# Patient Record
Sex: Female | Born: 1945 | Race: Black or African American | Hispanic: No | Marital: Single | Smoking: Never smoker
Health system: Southern US, Community
[De-identification: ages and names within clinical notes are randomized; demographics above are authoritative.]

## PROBLEM LIST (undated history)

## (undated) DIAGNOSIS — N289 Disorder of kidney and ureter, unspecified: Secondary | ICD-10-CM

## (undated) DIAGNOSIS — I2510 Atherosclerotic heart disease of native coronary artery without angina pectoris: Secondary | ICD-10-CM

## (undated) DIAGNOSIS — F0390 Unspecified dementia without behavioral disturbance: Secondary | ICD-10-CM

## (undated) DIAGNOSIS — I639 Cerebral infarction, unspecified: Secondary | ICD-10-CM

## (undated) DIAGNOSIS — I10 Essential (primary) hypertension: Secondary | ICD-10-CM

## (undated) DIAGNOSIS — R569 Unspecified convulsions: Secondary | ICD-10-CM

## (undated) DIAGNOSIS — E119 Type 2 diabetes mellitus without complications: Secondary | ICD-10-CM

---

## 2011-01-27 ENCOUNTER — Inpatient Hospital Stay: Payer: Self-pay | Admitting: Internal Medicine

## 2011-01-28 DIAGNOSIS — I517 Cardiomegaly: Secondary | ICD-10-CM

## 2011-01-28 DIAGNOSIS — R748 Abnormal levels of other serum enzymes: Secondary | ICD-10-CM

## 2011-02-11 ENCOUNTER — Encounter: Payer: Self-pay | Admitting: Cardiovascular Disease

## 2011-02-18 ENCOUNTER — Encounter: Payer: Self-pay | Admitting: Cardiovascular Disease

## 2012-10-15 IMAGING — CT CT HEAD WITHOUT CONTRAST
2 series · 15 of 30 positions shown, 19 images · non-contrast
Comparison: none

REASON FOR EXAM: syncope
COMMENTS:

[Series 2: without · axial · non-contrast · 0.43mm/px · z∈[-222,-92]mm · 13 of 32 slices shown, 17 images]
[im 3/32  brain]
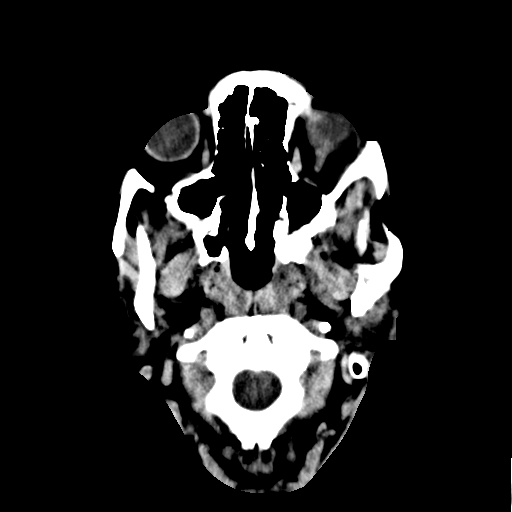
[im 3/32  bone]
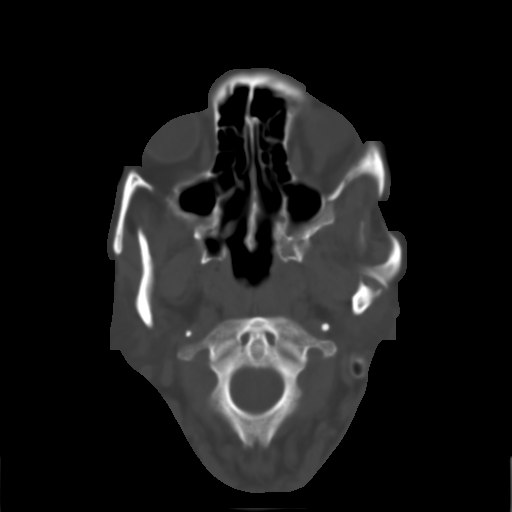
[im 5/32  brain]
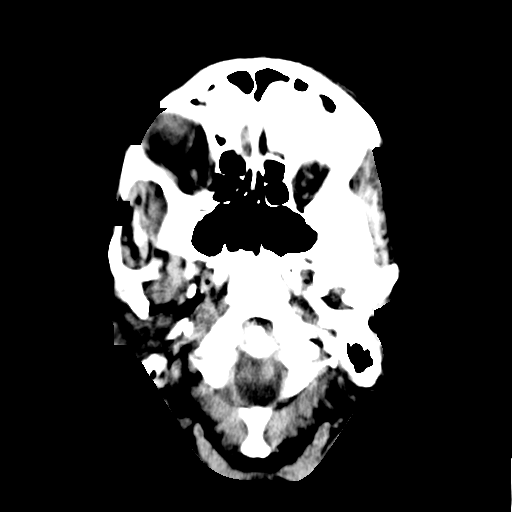
[im 7/32  brain]
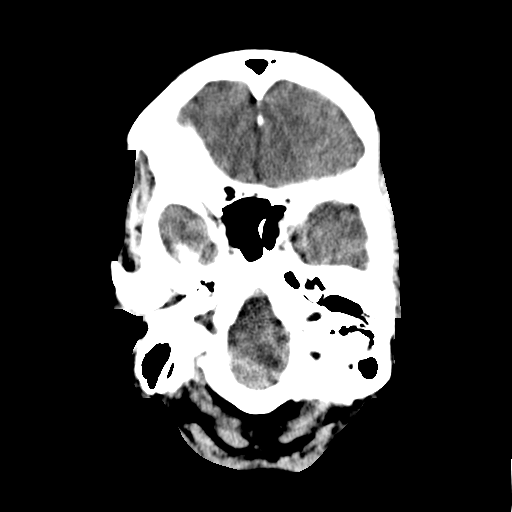
[im 9/32  brain]
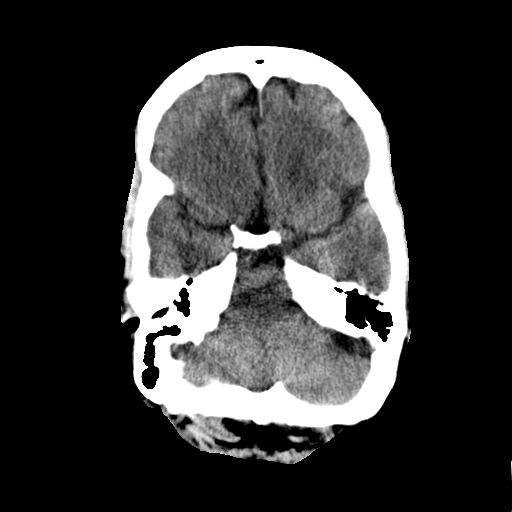
[im 12/32  brain]
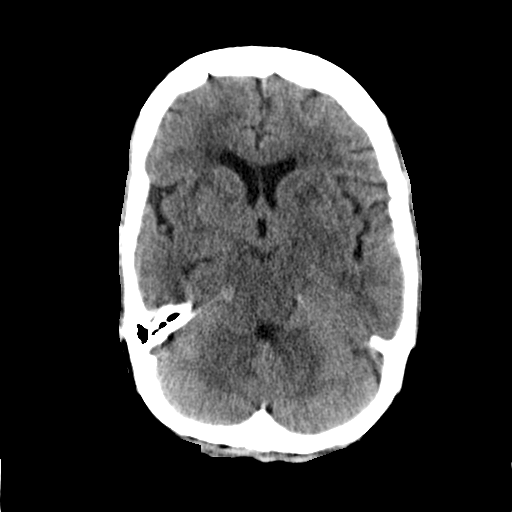
[im 12/32  bone]
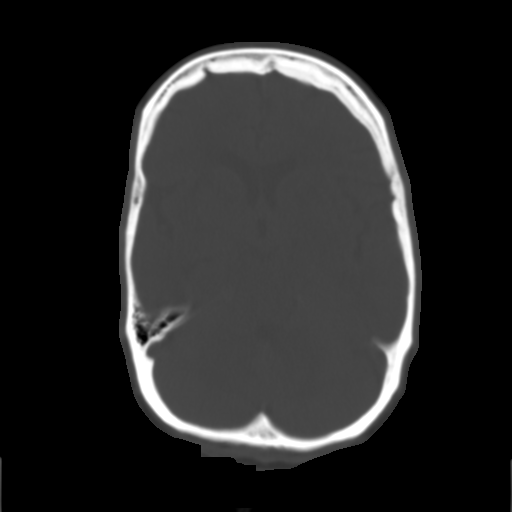
[im 14/32  brain]
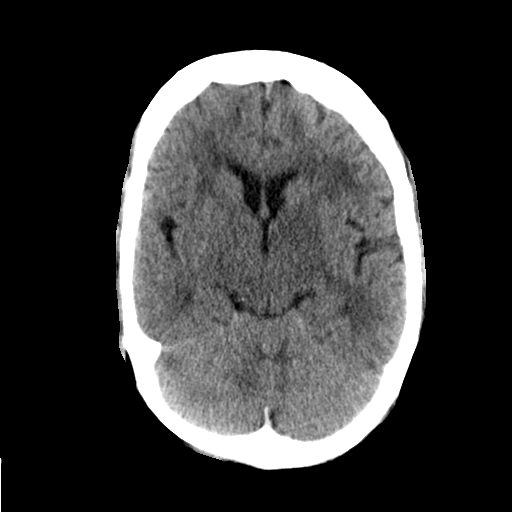
[im 16/32  brain]
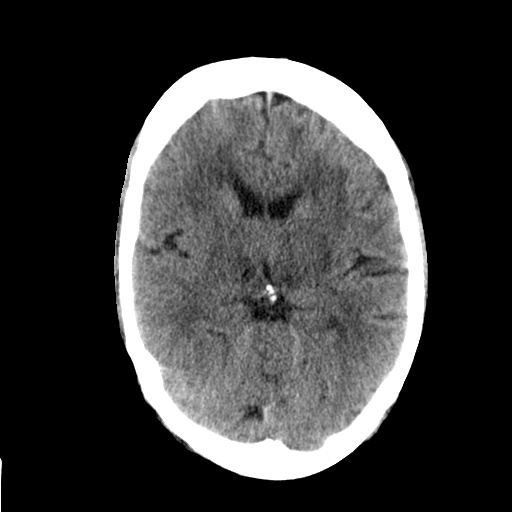
[im 18/32  brain]
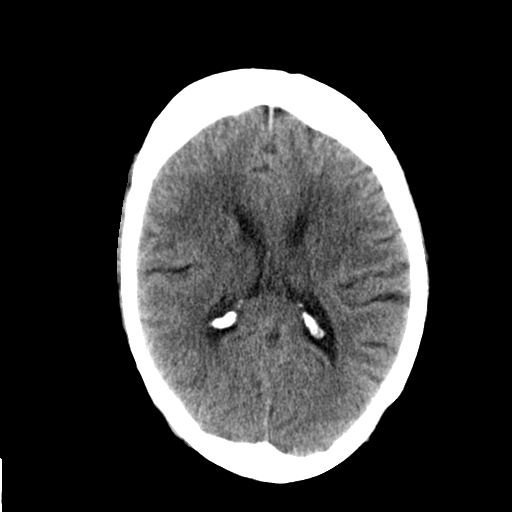
[im 20/32  brain]
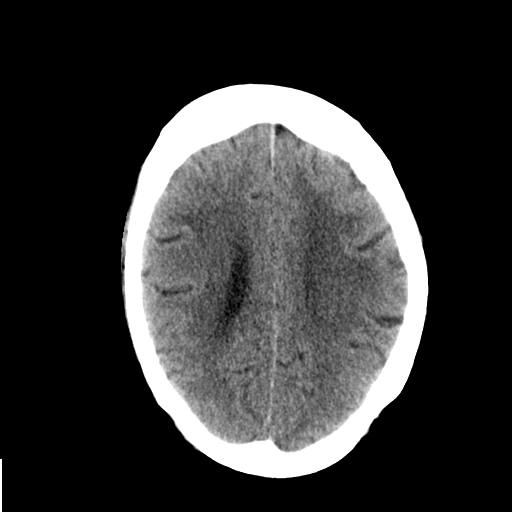
[im 20/32  bone]
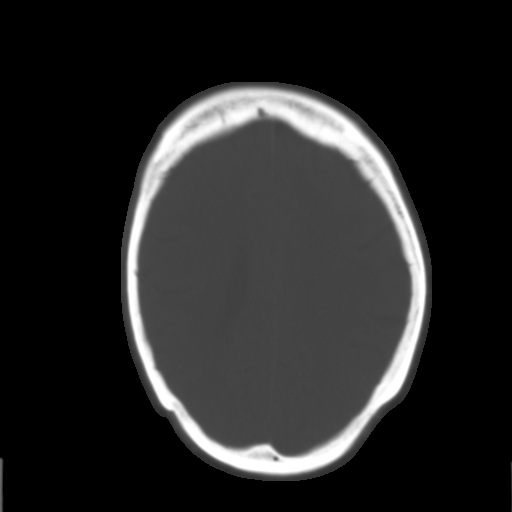
[im 23/32  brain]
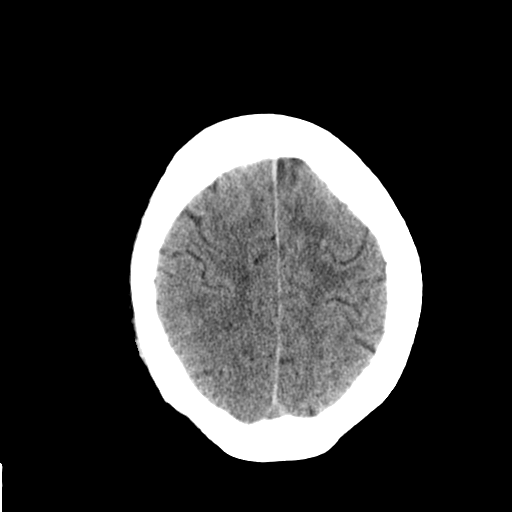
[im 25/32  brain]
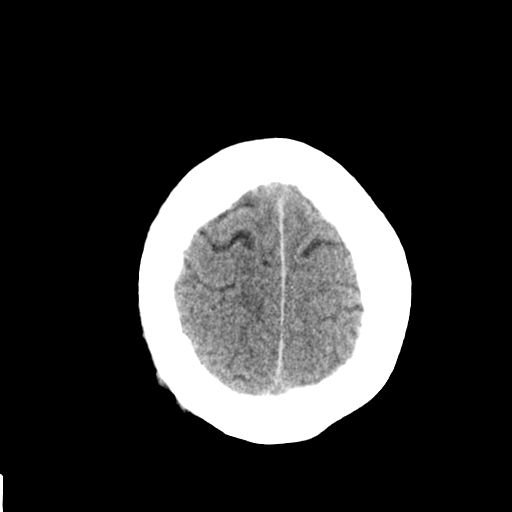
[im 27/32  brain]
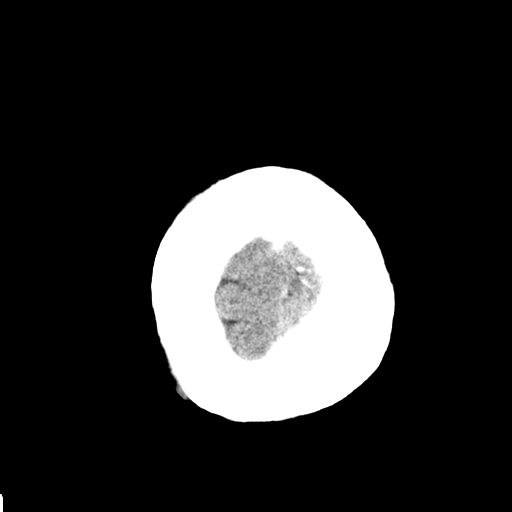
[im 29/32  brain]
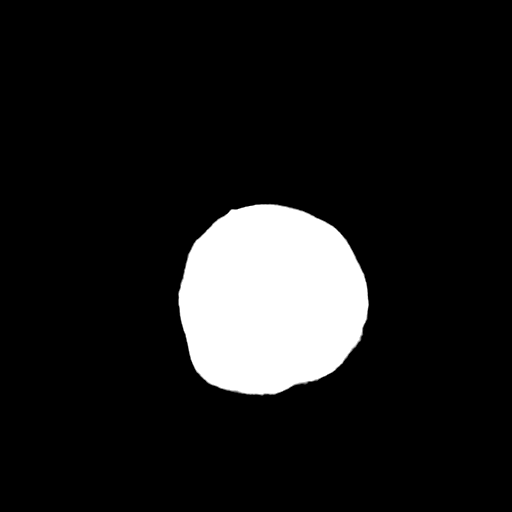
[im 29/32  bone]
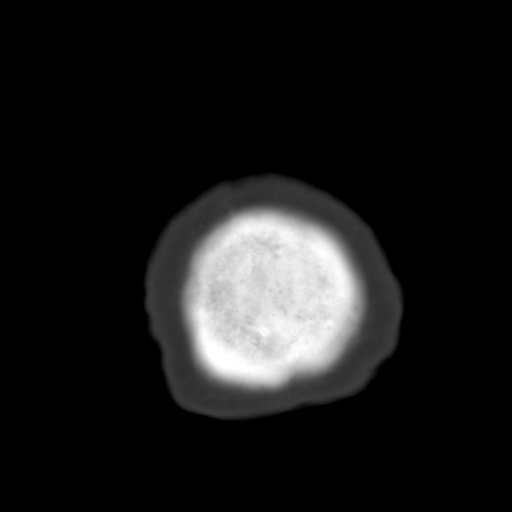

[Series 3: bone · axial · 0.43mm/px · z∈[-222,-202]mm · 2 of 32 slices shown]
[im 3/32  bone]
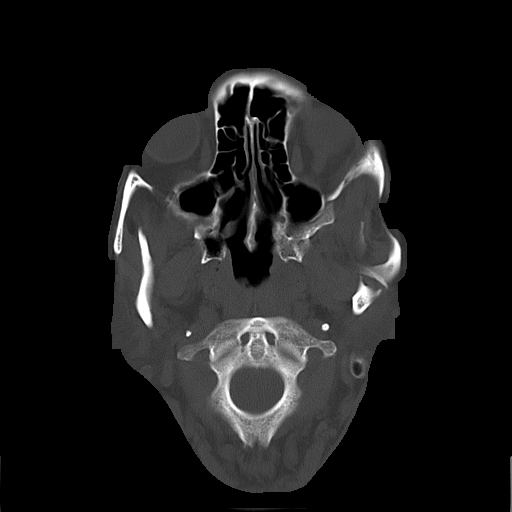
[im 7/32  bone]
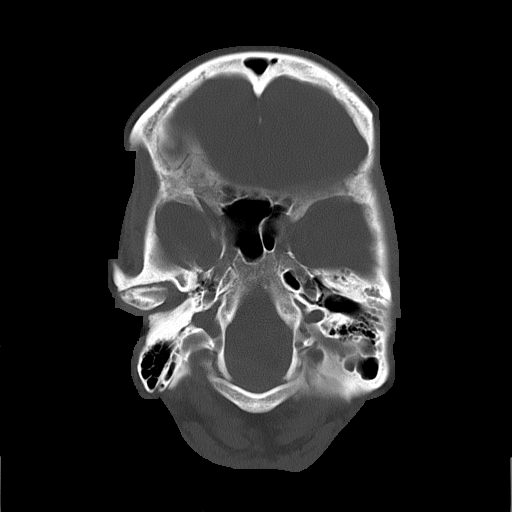

[15 of 30 positions shown; findings below may reference images not displayed]

PROCEDURE:     CT  - CT HEAD WITHOUT CONTRAST  - January 27, 2011  [DATE]

RESULT:     Axial noncontrast CT scanning was performed through the brain at
5 mm intervals and slice thicknesses. There are no previous studies for
comparison.

The ventricles are normal in size and position. There is decreased density
in the deep white matter of both cerebral hemispheres which likely reflects
the sequelae of chronic small vessel ischemic type change. There is
hypodensity in the thalamic nuclei bilaterally most compatible with previous
lacunar infarctions. The cerebellum and brainstem exhibit no acute
abnormality. There is no acute intracranial hemorrhage nor evidence of an
evolving ischemic event. At bone window settings the observed portions of
the paranasal sinuses reveal no air-fluid levels, but there is
mucoperiosteal thickening in the right maxillary sinus. I do not see
evidence of an acute skull fracture.
IMPRESSION: 1. I do not see evidence of an acute ischemic or hemorrhagic infarction.
2. There is no intracranial mass effect or hydrocephalus.
3. There is decreased density in the deep white matter of both cerebral
hemispheres as well as in the thalamic nuclei consistent with chronic small
vessel ischemic type change and likely old lacunar infarctions respectively.

## 2012-10-17 IMAGING — CR DG CHEST 1V PORT
1 series · 1 of 1 positions shown · non-contrast
Comparison: none

REASON FOR EXAM: acute sob
COMMENTS:

[view not recorded]
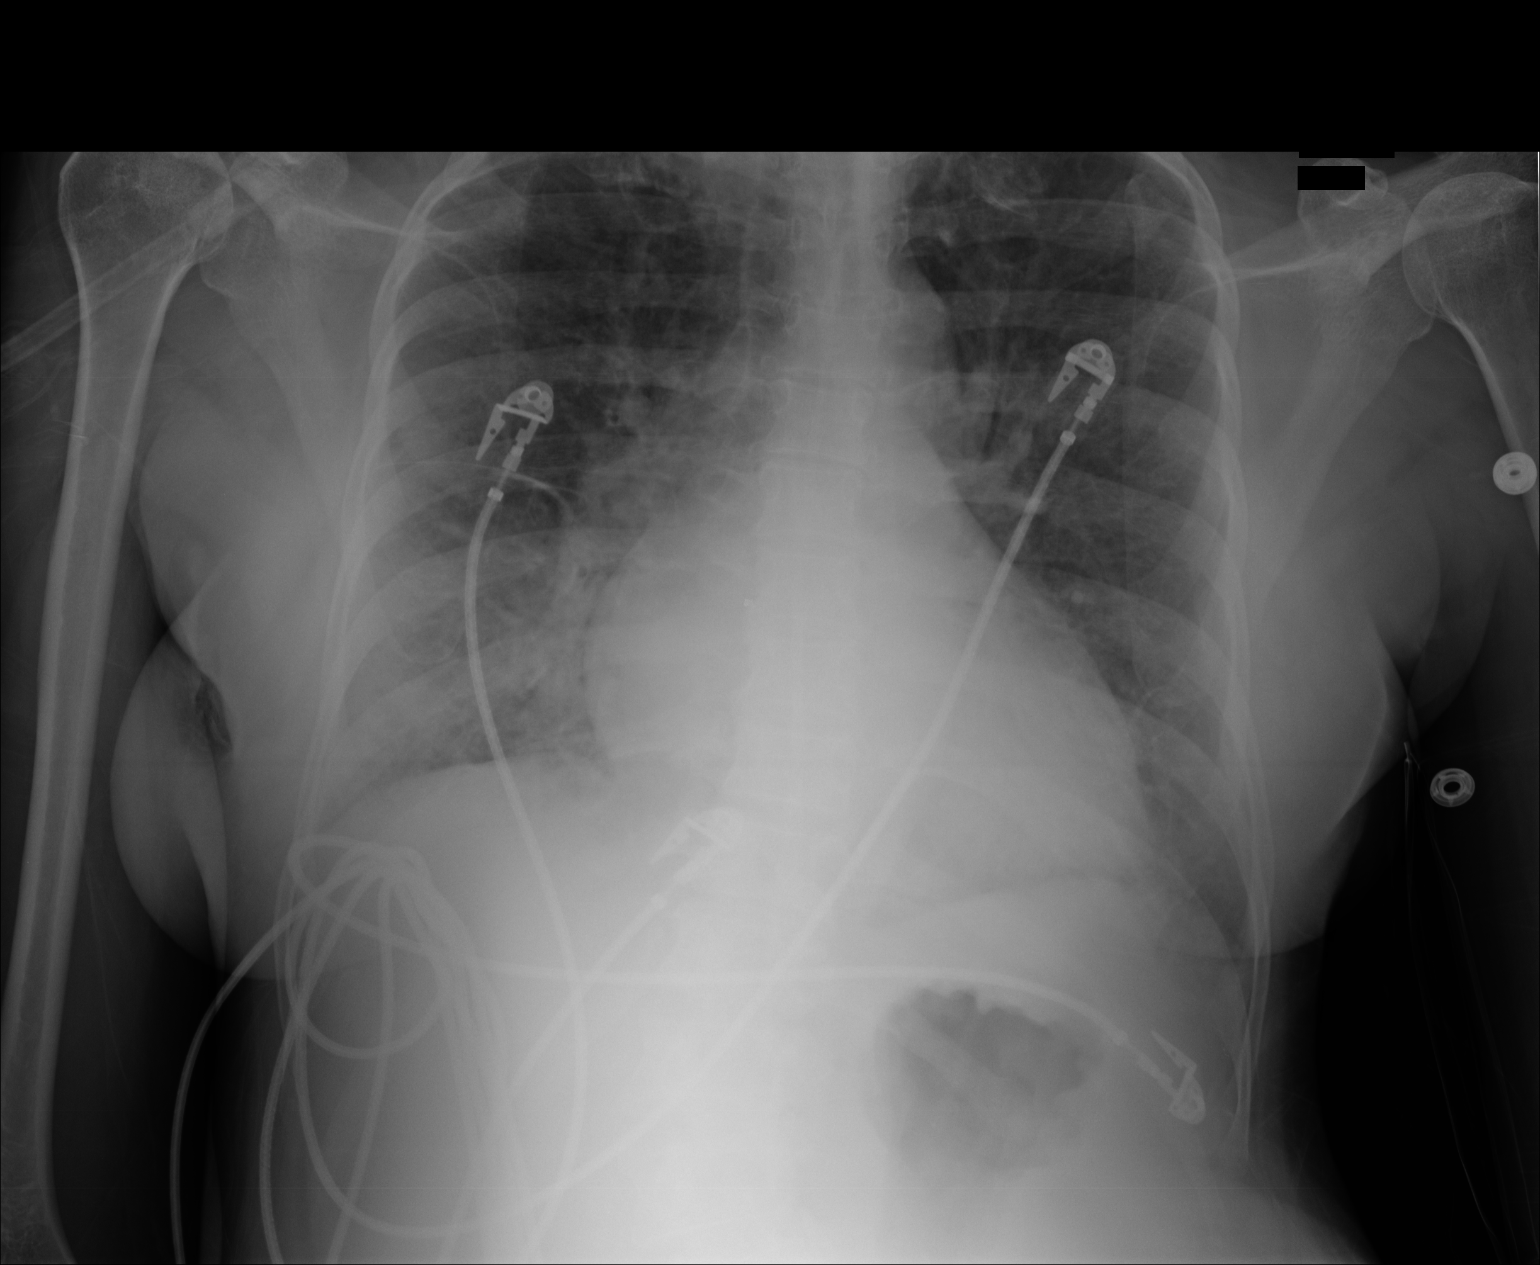

[1 of 1 positions shown; findings below may reference images not displayed]

PROCEDURE:     DXR - DXR PORTABLE CHEST SINGLE VIEW  - January 29, 2011  [DATE]

RESULT:     Comparison is made to the study 27 January, 2011.

The lungs are well-expanded. There are increased interstitial markings noted
on the right in the mid and lower lung. The pulmonary vascularity is
indistinct. The cardiac silhouette is enlarged. I see no pleural effusion.
IMPRESSION: The findings suggest low-grade CHF with mild interstitial
edema on the right especially. A followup PA and lateral chest x-ray would
be of value.

## 2014-11-21 ENCOUNTER — Emergency Department (HOSPITAL_COMMUNITY): Payer: Medicare Other

## 2014-11-21 ENCOUNTER — Encounter (HOSPITAL_COMMUNITY): Payer: Self-pay | Admitting: *Deleted

## 2014-11-21 ENCOUNTER — Emergency Department (HOSPITAL_COMMUNITY)
Admission: EM | Admit: 2014-11-21 | Discharge: 2014-11-21 | Disposition: A | Discharge status: Home / Self Care | Payer: Medicare Other | Attending: Emergency Medicine | Admitting: Emergency Medicine

## 2014-11-21 DIAGNOSIS — Z7982 Long term (current) use of aspirin: Secondary | ICD-10-CM | POA: Diagnosis not present

## 2014-11-21 DIAGNOSIS — I2510 Atherosclerotic heart disease of native coronary artery without angina pectoris: Secondary | ICD-10-CM | POA: Insufficient documentation

## 2014-11-21 DIAGNOSIS — Z79899 Other long term (current) drug therapy: Secondary | ICD-10-CM | POA: Insufficient documentation

## 2014-11-21 DIAGNOSIS — W01198A Fall on same level from slipping, tripping and stumbling with subsequent striking against other object, initial encounter: Secondary | ICD-10-CM | POA: Diagnosis not present

## 2014-11-21 DIAGNOSIS — N189 Chronic kidney disease, unspecified: Secondary | ICD-10-CM | POA: Insufficient documentation

## 2014-11-21 DIAGNOSIS — Z8673 Personal history of transient ischemic attack (TIA), and cerebral infarction without residual deficits: Secondary | ICD-10-CM | POA: Diagnosis not present

## 2014-11-21 DIAGNOSIS — R55 Syncope and collapse: Secondary | ICD-10-CM | POA: Diagnosis present

## 2014-11-21 DIAGNOSIS — Y9289 Other specified places as the place of occurrence of the external cause: Secondary | ICD-10-CM | POA: Diagnosis not present

## 2014-11-21 DIAGNOSIS — S0990XA Unspecified injury of head, initial encounter: Secondary | ICD-10-CM | POA: Diagnosis not present

## 2014-11-21 DIAGNOSIS — I129 Hypertensive chronic kidney disease with stage 1 through stage 4 chronic kidney disease, or unspecified chronic kidney disease: Secondary | ICD-10-CM | POA: Insufficient documentation

## 2014-11-21 DIAGNOSIS — R000 Tachycardia, unspecified: Secondary | ICD-10-CM | POA: Insufficient documentation

## 2014-11-21 DIAGNOSIS — Y998 Other external cause status: Secondary | ICD-10-CM | POA: Diagnosis not present

## 2014-11-21 DIAGNOSIS — Y9389 Activity, other specified: Secondary | ICD-10-CM | POA: Diagnosis not present

## 2014-11-21 DIAGNOSIS — G40909 Epilepsy, unspecified, not intractable, without status epilepticus: Secondary | ICD-10-CM | POA: Insufficient documentation

## 2014-11-21 HISTORY — DX: Atherosclerotic heart disease of native coronary artery without angina pectoris: I25.10

## 2014-11-21 HISTORY — DX: Cerebral infarction, unspecified: I63.9

## 2014-11-21 HISTORY — DX: Essential (primary) hypertension: I10

## 2014-11-21 HISTORY — DX: Disorder of kidney and ureter, unspecified: N28.9

## 2014-11-21 LAB — CBC WITH DIFFERENTIAL/PLATELET
BASOS ABS: 0 10*3/uL (ref 0.0–0.1)
BASOS PCT: 0 % (ref 0–1)
Eosinophils Absolute: 0 10*3/uL (ref 0.0–0.7)
Eosinophils Relative: 0 % (ref 0–5)
HCT: 38.6 % (ref 36.0–46.0)
HEMOGLOBIN: 12.4 g/dL (ref 12.0–15.0)
Lymphocytes Relative: 17 % (ref 12–46)
Lymphs Abs: 1.2 10*3/uL (ref 0.7–4.0)
MCH: 26.5 pg (ref 26.0–34.0)
MCHC: 32.1 g/dL (ref 30.0–36.0)
MCV: 82.5 fL (ref 78.0–100.0)
MONO ABS: 0.6 10*3/uL (ref 0.1–1.0)
MONOS PCT: 9 % (ref 3–12)
Neutro Abs: 4.9 10*3/uL (ref 1.7–7.7)
Neutrophils Relative %: 74 % (ref 43–77)
PLATELETS: 224 10*3/uL (ref 150–400)
RBC: 4.68 MIL/uL (ref 3.87–5.11)
RDW: 13.7 % (ref 11.5–15.5)
WBC: 6.7 10*3/uL (ref 4.0–10.5)

## 2014-11-21 LAB — COMPREHENSIVE METABOLIC PANEL
ALT: 12 U/L — AB (ref 14–54)
AST: 17 U/L (ref 15–41)
Albumin: 3.8 g/dL (ref 3.5–5.0)
Alkaline Phosphatase: 49 U/L (ref 38–126)
Anion gap: 11 (ref 5–15)
BILIRUBIN TOTAL: 0.5 mg/dL (ref 0.3–1.2)
BUN: 43 mg/dL — ABNORMAL HIGH (ref 6–20)
CHLORIDE: 105 mmol/L (ref 101–111)
CO2: 21 mmol/L — AB (ref 22–32)
Calcium: 9.2 mg/dL (ref 8.9–10.3)
Creatinine, Ser: 2.3 mg/dL — ABNORMAL HIGH (ref 0.44–1.00)
GFR calc Af Amer: 24 mL/min — ABNORMAL LOW (ref >60)
GFR calc non Af Amer: 20 mL/min — ABNORMAL LOW (ref >60)
Glucose, Bld: 191 mg/dL — ABNORMAL HIGH (ref 65–99)
Potassium: 3.5 mmol/L (ref 3.5–5.1)
Sodium: 137 mmol/L (ref 135–145)
Total Protein: 6.9 g/dL (ref 6.5–8.1)

## 2014-11-21 LAB — URINALYSIS, ROUTINE W REFLEX MICROSCOPIC
Bilirubin Urine: NEGATIVE
Glucose, UA: NEGATIVE mg/dL
Hgb urine dipstick: NEGATIVE
Ketones, ur: NEGATIVE mg/dL
Leukocytes, UA: NEGATIVE
NITRITE: NEGATIVE
PH: 5.5 (ref 5.0–8.0)
Protein, ur: NEGATIVE mg/dL
Specific Gravity, Urine: 1.01 (ref 1.005–1.030)
Urobilinogen, UA: 0.2 mg/dL (ref 0.0–1.0)

## 2014-11-21 LAB — TROPONIN I: Troponin I: <0.03 ng/mL (ref <0.031)

## 2014-11-21 MED ORDER — SODIUM CHLORIDE 0.9 % IV BOLUS (SEPSIS)
500.0000 mL | Freq: Once | INTRAVENOUS | Status: AC
  Administered 2014-11-21: 500 mL via INTRAVENOUS

## 2014-11-21 NOTE — ED Notes (Signed)
Lab at bedside

## 2014-11-21 NOTE — Discharge Instructions (Signed)
Chronic Kidney Disease °Chronic kidney disease occurs when the kidneys are damaged over a long period. The kidneys are two organs that lie on either side of the spine between the middle of the back and the front of the abdomen. The kidneys:  °· Remove wastes and extra water from the blood.   °· Produce important hormones. These help keep bones strong, regulate blood pressure, and help create red blood cells.   °· Balance the fluids and chemicals in the blood and tissues. °A small amount of kidney damage may not cause problems, but a large amount of damage may make it difficult or impossible for the kidneys to work the way they should. If steps are not taken to slow down the kidney damage or stop it from getting worse, the kidneys may stop working permanently. Most of the time, chronic kidney disease does not go away. However, it can often be controlled, and those with the disease can usually live normal lives. °CAUSES  °The most common causes of chronic kidney disease are diabetes and high blood pressure (hypertension). Chronic kidney disease may also be caused by:  °· Diseases that cause the kidneys' filters to become inflamed.   °· Diseases that affect the immune system.   °· Genetic diseases.   °· Medicines that damage the kidneys, such as anti-inflammatory medicines.   °· Poisoning or exposure to toxic substances.   °· A reoccurring kidney or urinary infection.   °· A problem with urine flow. This may be caused by:   °¨ Cancer.   °¨ Kidney stones.   °¨ An enlarged prostate in males. °SIGNS AND SYMPTOMS  °Because the kidney damage in chronic kidney disease occurs slowly, symptoms develop slowly and may not be obvious until the kidney damage becomes severe. A person may have a kidney disease for years without showing any symptoms. Symptoms can include:  °· Swelling (edema) of the legs, ankles, or feet.   °· Tiredness (lethargy).   °· Nausea or vomiting.   °· Confusion.   °· Problems with urination, such as:    °¨ Decreased urine production.   °¨ Frequent urination, especially at night.   °¨ Frequent accidents in children who are potty trained.   °· Muscle twitches and cramps.   °· Shortness of breath.  °· Weakness.   °· Persistent itchiness.   °· Loss of appetite. °· Metallic taste in the mouth. °· Trouble sleeping. °· Slowed development in children. °· Short stature in children. °DIAGNOSIS  °Chronic kidney disease may be detected and diagnosed by tests, including blood, urine, imaging, or kidney biopsy tests.  °TREATMENT  °Most chronic kidney diseases cannot be cured. Treatment usually involves relieving symptoms and preventing or slowing the progression of the disease. Treatment may include:  °· A special diet. You may need to avoid alcohol and foods that are salty and high in potassium.   °· Medicines. These may:   °¨ Lower blood pressure.   °¨ Relieve anemia.   °¨ Relieve swelling.   °¨ Protect the bones. °HOME CARE INSTRUCTIONS  °· Follow your prescribed diet.   °· Take medicines only as directed by your health care provider. Do not take any new medicines (prescription, over-the-counter, or nutritional supplements) unless approved by your health care provider. Many medicines can worsen your kidney damage or need to have the dose adjusted.   °· Quit smoking if you smoke. Talk to your health care provider about a smoking cessation program.   °· Keep all follow-up visits as directed by your health care provider. °SEEK IMMEDIATE MEDICAL CARE IF: °· Your symptoms get worse or you develop new symptoms.   °· You develop symptoms of end-stage kidney disease. These   include:   Headaches.   Abnormally dark or light skin.   Numbness in the hands or feet.   Easy bruising.   Frequent hiccups.   Menstruation stops.   You have a fever.   You have decreased urine production.   You havepain or bleeding when urinating. MAKE SURE YOU:  Understand these instructions.  Will watch your condition.  Will  get help right away if you are not doing well or get worse. FOR MORE INFORMATION   American Association of Kidney Patients: BombTimer.gl  National Kid[employer] Foundation: www.kidney.[place]: https://mathis.com/  [facility]: www.lifeoptions.org and www.kidneyschool.org Document Released: 01/20/2008 Document Revised: 08/27/2013 Document Reviewed: 12/10/2011 [facility] Patient Information 2015 [place], [place]. This information is not intended to replace advice given to you by your health care provider. Make sure you discuss any questions you have with your health care provider.   Please obtain all of your results from medical records or have your doctors office obtain the results - share them with your doctor - you should be seen at your doctors office in the next 2 days. Call today to arrange your follow up. Take the medications as prescribed. Please review all of the medicines and only take them if you do not have an allergy to them. Please be aware that if you are taking birth control pills, taking other prescriptions, ESPECIALLY ANTIBIOTICS may make the birth control ineffective - if this is the case, either do not engage in sexual activity or use alternative methods of birth control such as condoms until you have finished the medicine and your family doctor says it is OK to restart them. If you are on a blood thinner such as COUMADIN, be aware that any other medicine that you take may cause the coumadin to either work too much, or not enough - you should have your coumadin level rechecked in next 7 days if this is the case.  ?  It is also a possibility that you have an allergic reaction to any of the medicines that you have been prescribed - Everybody reacts differently to medications and while MOST people have no trouble with most medicines, you may have a reaction such as nausea, vomiting, rash, swelling, shortness of breath. If this is the case, please stop taking  the medicine immediately and contact your physician.  ?  You should return to the ER if you develop severe or worsening symptoms.

## 2014-11-21 NOTE — ED Notes (Signed)
Pt comes in by EMS from local store. Pt had episode of syncope. Pt denies any neck or back pain. Pt is alert and oriented at this time. NAD noted. Pt is not on blood thinners.

## 2014-11-21 NOTE — ED Provider Notes (Signed)
CSN: [id number]     Arrival date & time 11/21/14  1357 History   First MD Initiated Contact with Patient 11/21/14 1357     Chief Complaint  Patient presents with  . Loss of Consciousness     (Consider location/radiation/quality/duration/timing/severity/associated sxs/prior Treatment) HPI Comments: The pt is a 69 y/o female with hx of stroke, htn and seizures - she takes meds for all these - she did not take her medicine on time today - took them at 1 PM - then ate and has been in the heat and shopping for the last hour - when she was getting ready to leave the store, she became acutely light headed and fell backwards striking her head on the ground with a LOC - the daughter turned around she noticed mild shaking activity - this resolved after a few seconds and she took less than one minute to come around.  She had no vomiting and got back to herself.  She denies prodromal HA / CP / palpitations / sob and has had normal appetite, no dysuria or diarrhea, no swelling, weakness or numbness.  She has no c/o at this time.  Patient is a 69 y.o. female presenting with syncope. The history is provided by the patient.  Loss of Consciousness   Past Medical History  Diagnosis Date  . Hypertension   . Stroke   . Renal disorder   . Coronary artery disease    History reviewed. No pertinent past surgical history. No family history on file. History  Substance Use Topics  . Smoking status: Never Smoker   . Smokeless tobacco: Not on file  . Alcohol Use: No   OB History    No data available     Review of Systems  Cardiovascular: Positive for syncope.  All other systems reviewed and are negative.     Allergies  Review of patient's allergies indicates no known allergies.  Home Medications   Prior to Admission medications   Medication Sig Start Date End Date Taking? Authorizing Provider  amLODipine (NORVASC) 10 MG tablet Take 10 mg by mouth daily.   Yes Historical Provider, MD  aspirin EC  81 MG tablet Take 81 mg by mouth daily.   Yes Historical Provider, MD  atorvastatin (LIPITOR) 20 MG tablet Take 20 mg by mouth daily.   Yes Historical Provider, MD  cholecalciferol (VITAMIN D) 1000 UNITS tablet Take 1,000 Units by mouth daily.   Yes Historical Provider, MD  citalopram (CELEXA) 40 MG tablet Take 40 mg by mouth daily.   Yes Historical Provider, MD  cloNIDine (CATAPRES) 0.2 MG tablet Take 0.2 mg by mouth 3 (three) times daily.   Yes Historical Provider, MD  docusate sodium (COLACE) 100 MG capsule Take 100 mg by mouth 2 (two) times daily.   Yes Historical Provider, MD  ferrous sulfate 325 (65 FE) MG tablet Take 325 mg by mouth 3 (three) times daily with meals.   Yes Historical Provider, MD  hydrALAZINE (APRESOLINE) 100 MG tablet Take 100 mg by mouth 3 (three) times daily.   Yes Historical Provider, MD  isosorbide dinitrate (ISORDIL) 20 MG tablet Take 20 mg by mouth 3 (three) times daily.   Yes Historical Provider, MD  levETIRAcetam (KEPPRA) 500 MG tablet Take 1,000 mg by mouth 2 (two) times daily.   Yes Historical Provider, MD  magnesium oxide (MAG-OX) 400 MG tablet Take 400 mg by mouth daily.   Yes Historical Provider, MD  potassium chloride SA (K-DUR,KLOR-CON) 20 MEQ tablet Take  20 mEq by mouth daily.   Yes Historical Provider, MD   BP 178/87 mmHg  Pulse 103  Temp(Src) 98 F (36.7 C) (Oral)  Resp 14  Ht 6' (1.829 m)  Wt 135 lb (61.236 kg)  BMI 18.31 kg/m2  SpO2 97% Physical Exam  Constitutional: She appears well-developed and well-nourished. No distress.  HENT:  Head: Normocephalic and atraumatic.  Mouth/Throat: Oropharynx is clear and moist. No oropharyngeal exudate.  no facial tenderness, deformity, malocclusion or hemotympanum.  no battle's sign or racoon eyes. No ttp over the scalp.  Eyes: Conjunctivae and EOM are normal. Pupils are equal, round, and reactive to light. Right eye exhibits no discharge. Left eye exhibits no discharge. No scleral icterus.  Neck: Normal  range of motion. Neck supple. No JVD present. No thyromegaly present.  Cardiovascular: Regular rhythm, normal heart sounds and intact distal pulses.  Exam reveals no gallop and no friction rub.   No murmur heard. Tachycardia but no JVD, no edema, normal pulses at hte radial arteries bilaterally.  Pulmonary/Chest: Effort normal and breath sounds normal. No respiratory distress. She has no wheezes. She has no rales.  Abdominal: Soft. Bowel sounds are normal. She exhibits no distension and no mass. There is no tenderness.  Musculoskeletal: Normal range of motion. She exhibits no edema or tenderness.  Lymphadenopathy:    She has no cervical adenopathy.  Neurological: She is alert. Coordination normal.  Neurologic exam:  Speech clear, pupils equal round reactive to light, extraocular movements intact  Normal peripheral visual fields Cranial nerves III through XII normal including no facial droop Follows commands, moves all extremities x4, normal strength to bilateral upper and lower extremities at all major muscle groups including grip Sensation normal to light touch and pinprick Coordination intact, no limb ataxia, finger-nose-finger normal Rapid alternating movements normal No pronator drift  Skin: Skin is warm and dry. No rash noted. No erythema.  Psychiatric: She has a normal mood and affect. Her behavior is normal.  Nursing note and vitals reviewed.   ED Course  Procedures (including critical care time) Labs Review Labs Reviewed  COMPREHENSIVE METABOLIC PANEL - Abnormal; Notable for the following:    CO2 21 (*)    Glucose, Bld 191 (*)    BUN 43 (*)    Creatinine, Ser 2.30 (*)    ALT 12 (*)    GFR calc non Af Amer 20 (*)    GFR calc Af Amer 24 (*)    All other components within normal limits  CBC WITH DIFFERENTIAL/PLATELET  URINALYSIS, ROUTINE W REFLEX MICROSCOPIC (NOT AT [facility])  TROPONIN I    Imaging Review Ct Head Wo Contrast  11/21/2014   CLINICAL DATA:  Syncopal episode  2 day.  EXAM: CT HEAD WITHOUT CONTRAST  TECHNIQUE: Contiguous axial images were obtained from the base of the skull through the vertex without intravenous contrast.  COMPARISON:  None.  FINDINGS: Moderate attenuation is noted in the anterior limb of the internal capsule bilaterally. There is involvement of the left caudate head. A nonhemorrhagic lacunar infarct is noted in the inferior aspect of the right lentiform nucleus. Nonhemorrhagic lacunar infarcts are present within the thalami bilaterally, right greater than left.  The insular cortex is intact bilaterally. The remainder the cortex is unremarkable.  Wall thickening and mucosal thickening in the right maxillary sinus is noted within antrostomy. The remaining paranasal sinuses are clear. The mastoid air cells are clear bilaterally. The calvarium is intact.  No significant extracranial soft tissue injury is present.  The globes and orbits are within normal limits.  IMPRESSION: 1. Age indeterminate lacunar infarcts within the basal ganglia bilaterally as described. These are likely remote. 2. No other acute intracranial abnormality. 3. Moderate atrophy and white matter disease is somewhat advanced for age.   Electronically Signed   By: San Morelle M.D.   On: 11/21/2014 15:13     EKG Interpretation   Date/Time:  Thursday November 21 2014 13:58:12 EDT Ventricular Rate:  125 PR Interval:  150 QRS Duration: 89 QT Interval:  311 QTC Calculation: 448 R Axis:   36 Text Interpretation:  Sinus tachycardia LAE, consider biatrial enlargement  Borderline repolarization abnormality Since last tracing rate faster  otherwise no other changes Confirmed by Ewald Beg  MD, Ocean Schildt ([id number]) on  11/21/2014 2:06:15 PM      EKG Interpretation  Date/Time:  Thursday November 21 2014 15:40:30 EDT Ventricular Rate:  99 PR Interval:  155 QRS Duration: 93 QT Interval:  347 QTC Calculation: 445 R Axis:   43 Text Interpretation:  Sinus rhythm Biatrial enlargement  Nonspecific T abnormalities, lateral leads Since last tracing rate slower Abnormal ekg Confirmed by Sabra Heck  MD, Anaysia Germer ([id number]) on 11/21/2014 3:43:19 PM        MDM   Final diagnoses:  Head injury  Syncope, unspecified syncope type  Chronic renal failure, unspecified stage    No signs of head injury - she has no neuro findings and no ha or neck pain - she has tachycardia which worsens when I tell her the w/u and improves when she relaxes - may just be anxiety related however will r/o dehydradtion / UTI / anemia / ICH / fracture - pt in agreement.  CT and labs unremarkable - she has baseline renal disease based on Care Everywhere from past visit to other hospitals.    Pt informed of results - VS without tachycardia - repeat ECG looks better- pt will f/u outpt.  Meds given in ED:  Medications  sodium chloride 0.9 % bolus 500 mL (0 mLs Intravenous Stopped 11/21/14 1543)    New Prescriptions   No medications on file        Noemi Chapel, MD 11/21/14 1635

## 2015-12-13 ENCOUNTER — Emergency Department (HOSPITAL_COMMUNITY)
Admission: EM | Admit: 2015-12-13 | Discharge: 2015-12-13 | Disposition: A | Discharge status: Home / Self Care | Payer: Medicare Other | Attending: Emergency Medicine | Admitting: Emergency Medicine

## 2015-12-13 ENCOUNTER — Encounter (HOSPITAL_COMMUNITY): Payer: Self-pay | Admitting: *Deleted

## 2015-12-13 DIAGNOSIS — H43392 Other vitreous opacities, left eye: Secondary | ICD-10-CM | POA: Diagnosis not present

## 2015-12-13 DIAGNOSIS — I10 Essential (primary) hypertension: Secondary | ICD-10-CM | POA: Diagnosis not present

## 2015-12-13 DIAGNOSIS — I2510 Atherosclerotic heart disease of native coronary artery without angina pectoris: Secondary | ICD-10-CM | POA: Insufficient documentation

## 2015-12-13 DIAGNOSIS — Z7982 Long term (current) use of aspirin: Secondary | ICD-10-CM | POA: Insufficient documentation

## 2015-12-13 DIAGNOSIS — Z79899 Other long term (current) drug therapy: Secondary | ICD-10-CM | POA: Insufficient documentation

## 2015-12-13 DIAGNOSIS — H547 Unspecified visual loss: Secondary | ICD-10-CM | POA: Diagnosis present

## 2015-12-13 HISTORY — DX: Unspecified dementia, unspecified severity, without behavioral disturbance, psychotic disturbance, mood disturbance, and anxiety: F03.90

## 2015-12-13 MED ORDER — TETRACAINE HCL 0.5 % OP SOLN
2.0000 [drp] | Freq: Once | OPHTHALMIC | Status: AC
  Administered 2015-12-13: 2 [drp] via OPHTHALMIC
  Filled 2015-12-13: qty 4

## 2015-12-13 MED ORDER — FLUORESCEIN SODIUM 1 MG OP STRP
1.0000 | ORAL_STRIP | Freq: Once | OPHTHALMIC | Status: AC
  Administered 2015-12-13: 1 via OPHTHALMIC
  Filled 2015-12-13: qty 1

## 2015-12-13 NOTE — ED Notes (Signed)
Patient denies pain and is resting comfortably.  

## 2015-12-13 NOTE — ED Triage Notes (Signed)
Pt states she started seeing "spots" in her left eye yesterday morning. Denies any dizziness or unilateral weakness. Pt asking orientation questions. She originally answered that the year was 2016 and the president was Scott. She then said it was 2017 and Trump is our president.

## 2015-12-13 NOTE — ED Provider Notes (Signed)
[facility] DEPT Provider Note   CSN: [id number] Arrival date & time: 12/13/15  1659     History   Chief Complaint Chief Complaint  Patient presents with  . Decreased Visual Acuity    HPI Sally Hardy is a 70 y.o. female presents with left eye floaters. Started yesterday morning. She went to her doctor to ask about reflux but did not mention the floaters. Have been coming and going since. There is no blurry vision but in her inferior vision on her left eye only she sees intermittent floaters. Can last up to 5 minutes. No ocular pain or injury. No redness. No headache, weakness, or numbness. 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 Does not wear contacts or glasses typically.  HPI  Past Medical History:  Diagnosis Date  . Coronary artery disease   . Dementia   . Hypertension   . Renal disorder   . Stroke [facility])     There are no active problems to display for this patient.   No past surgical history on file.  OB History    No data available       Home Medications    Prior to Admission medications   Medication Sig Start Date End Date Taking? Authorizing Provider  aspirin EC 81 MG tablet Take 81 mg by mouth daily.   Yes Historical Provider, MD  cholecalciferol (VITAMIN D) 1000 UNITS tablet Take 1,000 Units by mouth daily.   Yes Historical Provider, MD  docusate sodium (COLACE) 100 MG capsule Take 100 mg by mouth 2 (two) times daily.   Yes Historical Provider, MD  ferrous sulfate 325 (65 FE) MG tablet Take 325 mg by mouth 3 (three) times daily with meals.   Yes Historical Provider, MD  levETIRAcetam (KEPPRA) 500 MG tablet Take 1,000 mg by mouth 2 (two) times daily.   Yes Historical Provider, MD  potassium chloride SA (K-DUR,KLOR-CON) 20 MEQ tablet Take 20 mEq by mouth daily.   Yes Historical Provider, MD  amLODipine (NORVASC) 10 MG tablet Take 10 mg by mouth daily.    Historical Provider, MD    atorvastatin (LIPITOR) 20 MG tablet Take 20 mg by mouth daily.    Historical Provider, MD  citalopram (CELEXA) 40 MG tablet Take 40 mg by mouth daily.    Historical Provider, MD  cloNIDine (CATAPRES) 0.2 MG tablet Take 0.2 mg by mouth 3 (three) times daily.    Historical Provider, MD  hydrALAZINE (APRESOLINE) 100 MG tablet Take 100 mg by mouth 3 (three) times daily.    Historical Provider, MD  isosorbide dinitrate (ISORDIL) 20 MG tablet Take 20 mg by mouth 3 (three) times daily.    Historical Provider, MD  magnesium oxide (MAG-OX) 400 MG tablet Take 400 mg by mouth daily.    Historical Provider, MD    Family History No family history on file.  Social History Social History  Substance Use Topics  . Smoking status: Never Smoker  . Smokeless tobacco: Never Used  . Alcohol use No     Allergies   Review of patient's allergies indicates no known allergies.   Review of Systems Review of Systems  Eyes: Positive for visual disturbance. Negative for pain and redness.  Cardiovascular: Negative for chest pain.  Neurological: Negative for weakness, numbness and headaches.  All other systems reviewed and are negative.    Physical Exam Updated Vital Signs BP 156/83 (BP Location: Left Arm)  Pulse 77   Temp 98.9 F (37.2 C) (Oral)   Resp 18   Ht 5\' 7"  (1.702 m)   Wt 191 lb (86.6 kg)   SpO2 98%   BMI 29.91 kg/m   Physical Exam  Constitutional: She is oriented to person, place, and time. She appears well-developed and well-nourished.  HENT:  Head: Normocephalic and atraumatic.  Right Ear: External ear normal.  Left Ear: External ear normal.  Nose: Nose normal.  Eyes: EOM are normal. Pupils are equal, round, and reactive to light. Right eye exhibits no discharge. Left eye exhibits no discharge. Right eye exhibits normal extraocular motion. Left eye exhibits normal extraocular motion.  Slit lamp exam:      The left eye shows no corneal abrasion, no foreign body, no hyphema, no  hypopyon and no fluorescein uptake.  IOP between 10-20  Cardiovascular: Normal rate, regular rhythm and normal heart sounds.   Pulmonary/Chest: Effort normal and breath sounds normal.  Abdominal: Soft. There is no tenderness.  Neurological: She is alert and oriented to person, place, and time.  Awake, alert, oriented x 3. Cn 3-12 grossly intact. 5/5 strength in all 4 extremities  Skin: Skin is warm and dry.  Nursing note and vitals reviewed.    ED Treatments / Results  Labs (all labs ordered are listed, but only abnormal results are displayed) Labs Reviewed - No data to display  EKG  EKG Interpretation None       Radiology No results found.  Procedures Procedures (including critical care time) Ultrasound: Limited Ocular  Performed and interpreted by Dr Regenia Skeeter Indication: left eye floaters Using high frequency linear probe, ultrasound of the globe was performed in real time in two planes with patient looking left and right.  Interpretation: No retinal detachment visualized.  Lens was in proper location.  No hemorrhage appreciated.  Images were electronically archived.     Medications Ordered in ED Medications  fluorescein ophthalmic strip 1 strip (1 strip Left Eye Given 12/13/15 1829)  tetracaine (PONTOCAINE) 0.5 % ophthalmic solution 2 drop (2 drops Left Eye Given by Other 12/13/15 1828)     Initial Impression / Assessment and Plan / ED Course  I have reviewed the triage vital signs and the nursing notes.  Pertinent labs & imaging results that were available during my care of the patient were reviewed by me and considered in my medical decision making (see chart for details).  Clinical Course    Visual acuity slightly worse in left eye. She is currently having a small amount of floaters in the inferior aspect of her vision. No ocular pain. No signs of glaucoma with IOP less than 20. I do not see any obvious retinal detachment on ultrasound. May have a small tear.  At this point will refer urgently to ophthalmology. Discussed importance of close follow-up with ophthalmology as well as strict return precautions. Otherwise there is no headache or obvious strokelike symptoms.  Final Clinical Impressions(s) / ED Diagnoses   Final diagnoses:  Vitreous floaters of left eye    New Prescriptions New Prescriptions   No medications on file     Sherwood Gambler, MD 12/13/15 [phone]

## 2017-08-04 ENCOUNTER — Emergency Department (HOSPITAL_COMMUNITY): Payer: Medicare Other

## 2017-08-04 ENCOUNTER — Other Ambulatory Visit: Payer: Self-pay

## 2017-08-04 ENCOUNTER — Emergency Department (HOSPITAL_COMMUNITY)
Admission: EM | Admit: 2017-08-04 | Discharge: 2017-08-04 | Disposition: A | Discharge status: Home / Self Care | Payer: Medicare Other | Attending: Emergency Medicine | Admitting: Emergency Medicine

## 2017-08-04 ENCOUNTER — Encounter (HOSPITAL_COMMUNITY): Payer: Self-pay | Admitting: *Deleted

## 2017-08-04 DIAGNOSIS — Z79899 Other long term (current) drug therapy: Secondary | ICD-10-CM | POA: Diagnosis not present

## 2017-08-04 DIAGNOSIS — I10 Essential (primary) hypertension: Secondary | ICD-10-CM | POA: Insufficient documentation

## 2017-08-04 DIAGNOSIS — Z7982 Long term (current) use of aspirin: Secondary | ICD-10-CM | POA: Insufficient documentation

## 2017-08-04 DIAGNOSIS — Z8673 Personal history of transient ischemic attack (TIA), and cerebral infarction without residual deficits: Secondary | ICD-10-CM | POA: Diagnosis not present

## 2017-08-04 DIAGNOSIS — R112 Nausea with vomiting, unspecified: Secondary | ICD-10-CM

## 2017-08-04 DIAGNOSIS — F0390 Unspecified dementia without behavioral disturbance: Secondary | ICD-10-CM | POA: Insufficient documentation

## 2017-08-04 DIAGNOSIS — I2510 Atherosclerotic heart disease of native coronary artery without angina pectoris: Secondary | ICD-10-CM | POA: Insufficient documentation

## 2017-08-04 LAB — COMPREHENSIVE METABOLIC PANEL
ALT: 26 U/L (ref 14–54)
AST: 32 U/L (ref 15–41)
Albumin: 4.1 g/dL (ref 3.5–5.0)
Alkaline Phosphatase: 91 U/L (ref 38–126)
Anion gap: 15 (ref 5–15)
BUN: 36 mg/dL — ABNORMAL HIGH (ref 6–20)
CHLORIDE: 96 mmol/L — AB (ref 101–111)
CO2: 27 mmol/L (ref 22–32)
CREATININE: 3.31 mg/dL — AB (ref 0.44–1.00)
Calcium: 10.8 mg/dL — ABNORMAL HIGH (ref 8.9–10.3)
GFR calc non Af Amer: 13 mL/min — ABNORMAL LOW (ref >60)
GFR, EST AFRICAN AMERICAN: 15 mL/min — AB (ref >60)
Glucose, Bld: 180 mg/dL — ABNORMAL HIGH (ref 65–99)
Potassium: 3.5 mmol/L (ref 3.5–5.1)
Sodium: 138 mmol/L (ref 135–145)
Total Bilirubin: 0.5 mg/dL (ref 0.3–1.2)
Total Protein: 8.4 g/dL — ABNORMAL HIGH (ref 6.5–8.1)

## 2017-08-04 LAB — I-STAT TROPONIN, ED: TROPONIN I, POC: 0 ng/mL (ref 0.00–0.08)

## 2017-08-04 LAB — LIPASE, BLOOD: Lipase: 44 U/L (ref 11–51)

## 2017-08-04 LAB — CBC
HCT: 35.5 % — ABNORMAL LOW (ref 36.0–46.0)
HEMOGLOBIN: 10.7 g/dL — AB (ref 12.0–15.0)
MCH: 21.2 pg — ABNORMAL LOW (ref 26.0–34.0)
MCHC: 30.1 g/dL (ref 30.0–36.0)
MCV: 70.3 fL — ABNORMAL LOW (ref 78.0–100.0)
Platelets: 243 10*3/uL (ref 150–400)
RBC: 5.05 MIL/uL (ref 3.87–5.11)
RDW: 18.5 % — ABNORMAL HIGH (ref 11.5–15.5)
WBC: 7.8 10*3/uL (ref 4.0–10.5)

## 2017-08-04 MED ORDER — ONDANSETRON 4 MG PO TBDP: 4.0000 mg | ORAL_TABLET | Freq: Three times a day (TID) | ORAL | 0 refills | Status: DC | PRN

## 2017-08-04 MED ORDER — LOPERAMIDE HCL 2 MG PO CAPS: 2.0000 mg | ORAL_CAPSULE | Freq: Four times a day (QID) | ORAL | 0 refills | Status: DC | PRN

## 2017-08-04 MED ORDER — ONDANSETRON HCL 4 MG/2ML IJ SOLN
4.0000 mg | Freq: Once | INTRAMUSCULAR | Status: AC
  Administered 2017-08-04: 4 mg via INTRAVENOUS
  Filled 2017-08-04: qty 2

## 2017-08-04 MED ORDER — SODIUM CHLORIDE 0.9 % IV BOLUS
500.0000 mL | Freq: Once | INTRAVENOUS | Status: AC
  Administered 2017-08-04: 500 mL via INTRAVENOUS

## 2017-08-04 NOTE — ED Provider Notes (Signed)
Emergency Department Provider Note   I have reviewed the triage vital signs and the nursing notes.   HISTORY  Chief Complaint Emesis   HPI Sally Hardy is a 72 y.o. female with PMH of CAD, HTN, and prior CVA resents to the emergency department for evaluation of nausea, vomiting, chills, headache which started this afternoon around 4 PM.  The patient denies any associated fever or diarrhea.  No sick contacts.  Symptoms began shortly after drinking a milkshake.  She denies any blood in the vomit.  Family states she vomited twice and seemed to have some chills shortly before.  She denies any significant nausea at this time.  Family states that she has been complaining of heartburn type symptoms for most of the day today.  Denies any specific chest pain or difficulty breathing. No radiation of symptoms or modifying factors.    Past Medical History:  Diagnosis Date  . Coronary artery disease   . Dementia   . Hypertension   . Renal disorder   . Stroke [facility])     There are no active problems to display for this patient.   History reviewed. No pertinent surgical history.  Current Outpatient Rx  . Order #: [id number] Class: Historical Med  . Order #: [id number] Class: Historical Med  . Order #: [id number] Class: Historical Med  . Order #: [id number] Class: Historical Med  . Order #: [id number] Class: Historical Med  . Order #: [id number] Class: Historical Med  . Order #: [id number] Class: Historical Med  . Order #: [id number] Class: Historical Med  . Order #: [id number] Class: Historical Med  . Order #: [id number] Class: Historical Med  . Order #: [id number] Class: Historical Med  . Order #: [id number] Class: Historical Med  . Order #: [id number] Class: Historical Med  . Order #: [id number] Class: Historical Med  . Order #: [id number] Class: Print  . Order #: [id number] Class: Print    Allergies Patient has no known allergies.  No family history on file.  Social History Social History   Tobacco  Use  . Smoking status: Never Smoker  . Smokeless tobacco: Never Used  Substance Use Topics  . Alcohol use: No  . Drug use: No    Review of Systems  Constitutional: No fever/chills Eyes: No visual changes. ENT: No sore throat. Cardiovascular: Denies chest pain. Respiratory: Denies shortness of breath. Gastrointestinal: No abdominal pain. Positive nausea and vomiting.  No diarrhea.  No constipation. Genitourinary: Negative for dysuria. Musculoskeletal: Negative for back pain. Skin: Negative for rash. Neurological: Negative for headaches, focal weakness or numbness.  10-point ROS otherwise negative.  ____________________________________________   PHYSICAL EXAM:  VITAL SIGNS: ED Triage Vitals  Enc Vitals Group     BP 08/04/17 1908 (!) 154/81     Pulse Rate 08/04/17 1908 86     Resp 08/04/17 1908 16     Temp 08/04/17 1908 98.8 F (37.1 C)     Temp Source 08/04/17 1908 Oral     SpO2 08/04/17 1908 95 %     Weight 08/04/17 1907 165 lb (74.8 kg)     Height 08/04/17 1907 5\' 7"  (1.702 m)     Pain Score 08/04/17 1908 0   Constitutional: Alert and oriented. Well appearing and in no acute distress. Eyes: Conjunctivae are normal.  Head: Atraumatic. Nose: No congestion/rhinnorhea. Mouth/Throat: Mucous membranes are moist. Neck: No stridor.   Cardiovascular: Normal rate, regular rhythm. Good peripheral circulation. Grossly normal heart sounds.   Respiratory: Normal respiratory effort.  No retractions. Lungs CTAB. Gastrointestinal: Soft and nontender. No distention.  Musculoskeletal: No lower extremity tenderness nor edema. No gross deformities of extremities. Neurologic:  Normal speech and language. No gross focal neurologic deficits are appreciated.  Skin:  Skin is warm, dry and intact. No rash noted.  ____________________________________________   LABS (all labs ordered are listed, but only abnormal results are displayed)  Labs Reviewed  COMPREHENSIVE METABOLIC PANEL -  Abnormal; Notable for the following components:      Result Value   Chloride 96 (*)    Glucose, Bld 180 (*)    BUN 36 (*)    Creatinine, Ser 3.31 (*)    Calcium 10.8 (*)    Total Protein 8.4 (*)    GFR calc non Af Amer 13 (*)    GFR calc Af Amer 15 (*)    All other components within normal limits  CBC - Abnormal; Notable for the following components:   Hemoglobin 10.7 (*)    HCT 35.5 (*)    MCV 70.3 (*)    MCH 21.2 (*)    RDW 18.5 (*)    All other components within normal limits  URINALYSIS, ROUTINE W REFLEX MICROSCOPIC - Abnormal; Notable for the following components:   Protein, ur 100 (*)    Squamous Epithelial / LPF 0-5 (*)    All other components within normal limits  LIPASE, BLOOD  I-STAT TROPONIN, ED   ____________________________________________  EKG   EKG Interpretation  Date/Time:  Thursday August 04 2017 21:15:57 EDT Ventricular Rate:  84 PR Interval:    QRS Duration: 95 QT Interval:  384 QTC Calculation: 454 R Axis:   30 Text Interpretation:  Sinus rhythm Abnormal T, consider ischemia, diffuse leads No STEMI. Similar to prior.  Confirmed by Nanda Quinton [phone]) on 08/04/2017 10:42:03 PM       ____________________________________________  RADIOLOGY  Dg Abdomen Acute W/chest  Result Date: 08/04/2017 CLINICAL DATA:  Vomiting. EXAM: DG ABDOMEN ACUTE W/ 1V CHEST COMPARISON:  None. FINDINGS: Heart size upper normal with mild aortic tortuosity. Linear atelectasis or scarring the left lung base. No confluent consolidation. No pulmonary edema. No pleural fluid. No free intra-abdominal air. No dilated bowel loops to suggest obstruction. Moderate stool burden throughout the colon. Stool in the rectum without abnormal rectal distention. Pelvic calcifications are likely phleboliths. Scoliotic curvature in the spine with associated degenerative change. IMPRESSION: 1. Normal bowel gas pattern. Moderate colonic stool burden without fecal impaction. 2. Left lung base  atelectasis or scarring. Electronically Signed   By: Jeb Levering M.D.   On: 08/04/2017 22:36    ____________________________________________   PROCEDURES  Procedure(s) performed:   Procedures  None ____________________________________________   INITIAL IMPRESSION / ASSESSMENT AND PLAN / ED COURSE  Pertinent labs & imaging results that were available during my care of the patient were reviewed by me and considered in my medical decision making (see chart for details).  Patient is an overall well-appearing 72 year old female who presents with nonspecific epigastric burning discomfort with nausea and vomiting x2 this afternoon.  Her abdomen is completely soft nontender to palpation.  She is not complaining of significant abdominal pain at this time.  Very low suspicion for atypical ACS.  EKG reviewed with no acute ischemic changes.  And for labs, IV fluids, Zofran, PO challenge and reassess.   10:40 PM Plain film of the abdomen/chest reviewed with no acute findings. Patient with known CKD and has f/u appointment with Nephrology in the coming week. She is tolerating PO in the ED with symptoms having resolved. Troponin negative. Plan  for discharge.   At this time, I do not feel there is any life-threatening condition present. I have reviewed and discussed all results (EKG, imaging, lab, urine as appropriate), exam findings with patient. I have reviewed nursing notes and appropriate previous records.  I feel the patient is safe to be discharged home without further emergent workup. Discussed usual and customary return precautions. Patient and family (if present) verbalize understanding and are comfortable with this plan.  Patient will follow-up with their primary care provider. If they do not have a primary care provider, information for follow-up has been provided to them. All questions have been answered.  ____________________________________________  FINAL CLINICAL IMPRESSION(S) / ED  DIAGNOSES  Final diagnoses:  Non-intractable vomiting with nausea, unspecified vomiting type     MEDICATIONS GIVEN DURING THIS VISIT:  Medications  sodium chloride 0.9 % bolus 500 mL (0 mLs Intravenous Stopped 08/04/17 2147)  ondansetron (ZOFRAN) injection 4 mg (4 mg Intravenous Given 08/04/17 2131)     NEW OUTPATIENT MEDICATIONS STARTED DURING THIS VISIT:  Discharge Medication List as of 08/04/2017 11:35 PM    START taking these medications   Details  loperamide (IMODIUM) 2 MG capsule Take 1 capsule (2 mg total) by mouth 4 (four) times daily as needed for diarrhea or loose stools., Starting Thu 08/04/2017, Print    ondansetron (ZOFRAN ODT) 4 MG disintegrating tablet Take 1 tablet (4 mg total) by mouth every 8 (eight) hours as needed for nausea or vomiting., Starting Thu 08/04/2017, Print        Note:  This document was prepared using Dragon voice recognition software and may include unintentional dictation errors.  Nanda Quinton, MD Emergency Medicine    Gwyn Mehring, [facility], MD 08/05/17 [phone]

## 2017-08-04 NOTE — ED Triage Notes (Signed)
Pt c/o n/v, chills, headache that started after drinking a milkshake today, denies any diarrhea, fever

## 2017-08-04 NOTE — ED Notes (Signed)
Pt drinking Ginger ale. Pt denies nausea at this time

## 2017-08-04 NOTE — Discharge Instructions (Signed)

## 2017-08-05 LAB — URINALYSIS, ROUTINE W REFLEX MICROSCOPIC
BACTERIA UA: NONE SEEN
Bilirubin Urine: NEGATIVE
Glucose, UA: NEGATIVE mg/dL
Hgb urine dipstick: NEGATIVE
Ketones, ur: NEGATIVE mg/dL
Leukocytes, UA: NEGATIVE
Nitrite: NEGATIVE
Protein, ur: 100 mg/dL — AB
SPECIFIC GRAVITY, URINE: 1.009 (ref 1.005–1.030)
pH: 5 (ref 5.0–8.0)

## 2018-09-25 ENCOUNTER — Encounter (HOSPITAL_COMMUNITY): Payer: Self-pay

## 2018-09-25 ENCOUNTER — Other Ambulatory Visit: Payer: Self-pay

## 2018-09-25 ENCOUNTER — Emergency Department (HOSPITAL_COMMUNITY): Payer: Medicare Other

## 2018-09-25 ENCOUNTER — Emergency Department (HOSPITAL_COMMUNITY)
Admission: EM | Admit: 2018-09-25 | Discharge: 2018-09-25 | Disposition: A | Discharge status: Home / Self Care | Payer: Medicare Other | Attending: Emergency Medicine | Admitting: Emergency Medicine

## 2018-09-25 DIAGNOSIS — Z8673 Personal history of transient ischemic attack (TIA), and cerebral infarction without residual deficits: Secondary | ICD-10-CM | POA: Insufficient documentation

## 2018-09-25 DIAGNOSIS — I10 Essential (primary) hypertension: Secondary | ICD-10-CM | POA: Diagnosis not present

## 2018-09-25 DIAGNOSIS — F0390 Unspecified dementia without behavioral disturbance: Secondary | ICD-10-CM | POA: Insufficient documentation

## 2018-09-25 DIAGNOSIS — I2510 Atherosclerotic heart disease of native coronary artery without angina pectoris: Secondary | ICD-10-CM | POA: Diagnosis not present

## 2018-09-25 DIAGNOSIS — R32 Unspecified urinary incontinence: Secondary | ICD-10-CM | POA: Diagnosis not present

## 2018-09-25 DIAGNOSIS — R569 Unspecified convulsions: Secondary | ICD-10-CM | POA: Diagnosis not present

## 2018-09-25 LAB — CBC
HCT: 30.2 % — ABNORMAL LOW (ref 36.0–46.0)
Hemoglobin: 8.1 g/dL — ABNORMAL LOW (ref 12.0–15.0)
MCH: 17.8 pg — ABNORMAL LOW (ref 26.0–34.0)
MCHC: 26.8 g/dL — ABNORMAL LOW (ref 30.0–36.0)
MCV: 66.5 fL — ABNORMAL LOW (ref 80.0–100.0)
Platelets: 288 10*3/uL (ref 150–400)
RBC: 4.54 MIL/uL (ref 3.87–5.11)
RDW: 19.1 % — ABNORMAL HIGH (ref 11.5–15.5)
WBC: 8.7 10*3/uL (ref 4.0–10.5)
nRBC: 0 % (ref 0.0–0.2)

## 2018-09-25 LAB — URINALYSIS, ROUTINE W REFLEX MICROSCOPIC
Bilirubin Urine: NEGATIVE
Glucose, UA: NEGATIVE mg/dL
Hgb urine dipstick: NEGATIVE
Ketones, ur: NEGATIVE mg/dL
Leukocytes,Ua: NEGATIVE
Nitrite: NEGATIVE
Protein, ur: NEGATIVE mg/dL
Specific Gravity, Urine: 1.006 (ref 1.005–1.030)
pH: 5 (ref 5.0–8.0)

## 2018-09-25 LAB — COMPREHENSIVE METABOLIC PANEL
ALT: 8 U/L (ref 0–44)
AST: 13 U/L — ABNORMAL LOW (ref 15–41)
Albumin: 3.1 g/dL — ABNORMAL LOW (ref 3.5–5.0)
Alkaline Phosphatase: 49 U/L (ref 38–126)
Anion gap: 8 (ref 5–15)
BUN: 35 mg/dL — ABNORMAL HIGH (ref 8–23)
CO2: 23 mmol/L (ref 22–32)
Calcium: 7.9 mg/dL — ABNORMAL LOW (ref 8.9–10.3)
Chloride: 108 mmol/L (ref 98–111)
Creatinine, Ser: 2.72 mg/dL — ABNORMAL HIGH (ref 0.44–1.00)
GFR calc Af Amer: 19 mL/min — ABNORMAL LOW (ref >60)
GFR calc non Af Amer: 17 mL/min — ABNORMAL LOW (ref >60)
Glucose, Bld: 113 mg/dL — ABNORMAL HIGH (ref 70–99)
Potassium: 3.6 mmol/L (ref 3.5–5.1)
Sodium: 139 mmol/L (ref 135–145)
Total Bilirubin: 0.3 mg/dL (ref 0.3–1.2)
Total Protein: 6.6 g/dL (ref 6.5–8.1)

## 2018-09-25 LAB — LACTIC ACID, PLASMA: Lactic Acid, Venous: 1.7 mmol/L (ref 0.5–1.9)

## 2018-09-25 MED ORDER — LEVETIRACETAM 500 MG PO TABS: 500.0000 mg | ORAL_TABLET | Freq: Every day | ORAL | 0 refills | Status: DC

## 2018-09-25 MED ORDER — LEVETIRACETAM 500 MG PO TABS
1000.0000 mg | ORAL_TABLET | Freq: Once | ORAL | Status: AC
  Administered 2018-09-25: 1000 mg via ORAL
  Filled 2018-09-25: qty 2

## 2018-09-25 NOTE — ED Provider Notes (Signed)
[facility] Emergency Department Provider Note MRN:  [id number]  Arrival date & time: 09/25/18     Chief Complaint   Seizures   History of Present Illness   Sally Hardy is a 73 y.o. year-old female with a history of CAD, dementia, seizure disorder presenting to the ED with chief complaint of seizure.  Patient does not recall events.  She remember her daughter doing her hair and then woke up on the ground.  She was told that she fell and hit her head on the sink, and then while on the ground had full body shakes and urinary incontinence.  No complaints currently.  I was unable to obtain an accurate HPI, PMH, or ROS due to the patient's dementia.   Review of Systems  Positive for fall, head trauma, seizure activity.  Patient's Health History    Past Medical History:  Diagnosis Date  . Coronary artery disease   . Dementia ([place])   . Hypertension   . Renal disorder   . Stroke [facility])     History reviewed. No pertinent surgical history.  No family history on file.  Social History   Socioeconomic History  . Marital status: Single    Spouse name: Not on file  . Number of children: Not on file  . Years of education: Not on file  . Highest education level: Not on file  Occupational History  . Not on file  Social Needs  . Financial resource strain: Not on file  . Food insecurity:    Worry: Not on file    Inability: Not on file  . Transportation needs:    Medical: Not on file    Non-medical: Not on file  Tobacco Use  . Smoking status: Never Smoker  . Smokeless tobacco: Never Used  Substance and Sexual Activity  . Alcohol use: No  . Drug use: No  . Sexual activity: Not on file  Lifestyle  . Physical activity:    Days per week: Not on file    Minutes per session: Not on file  . Stress: Not on file  Relationships  . Social connections:    Talks on phone: Not on file    Gets together: Not on file    Attends religious service: Not on file   Active member of club or organization: Not on file    Attends meetings of clubs or organizations: Not on file    Relationship status: Not on file  . Intimate partner violence:    Fear of current or ex partner: Not on file    Emotionally abused: Not on file    Physically abused: Not on file    Forced sexual activity: Not on file  Other Topics Concern  . Not on file  Social History Narrative  . Not on file     Physical Exam  Vital Signs and Nursing Notes reviewed Vitals:   09/25/18 1530 09/25/18 1630  BP: (!) 180/97 (!) 171/85  Pulse: 74 73  Resp:    Temp:    SpO2: 97% 98%    CONSTITUTIONAL:  well-appearing, NAD NEURO:  Alert, oriented to name and place, normal strength and sensation, normal speech EYES:  eyes equal and reactive ENT/NECK:  no LAD, no JVD CARDIO:  regular rate, well-perfused, normal S1 and S2 PULM:  CTAB no wheezing or rhonchi GI/GU:  normal bowel sounds, non-distended, non-tender MSK/SPINE:  No gross deformities, no edema SKIN:  no rash, atraumatic PSYCH:  Appropriate speech and behavior  Diagnostic and Interventional Summary    EKG Interpretation  Date/Time:  Monday September 25 2018 15:21:58 EDT Ventricular Rate:  77 PR Interval:    QRS Duration: 96 QT Interval:  490 QTC Calculation: 555 R Axis:   42 Text Interpretation:  Sinus rhythm Abnormal R-wave progression, early transition Nonspecific T abnormalities, diffuse leads Prolonged QT interval Confirmed by Gerlene Fee [phone]) on 09/25/2018 5:24:54 PM      Labs Reviewed  CBC - Abnormal; Notable for the following components:      Result Value   Hemoglobin 8.1 (*)    HCT 30.2 (*)    MCV 66.5 (*)    MCH 17.8 (*)    MCHC 26.8 (*)    RDW 19.1 (*)    All other components within normal limits  COMPREHENSIVE METABOLIC PANEL - Abnormal; Notable for the following components:   Glucose, Bld 113 (*)    BUN 35 (*)    Creatinine, Ser 2.72 (*)    Calcium 7.9 (*)    Albumin 3.1 (*)    AST 13 (*)    GFR  calc non Af Amer 17 (*)    GFR calc Af Amer 19 (*)    All other components within normal limits  URINALYSIS, ROUTINE W REFLEX MICROSCOPIC - Abnormal; Notable for the following components:   Color, Urine STRAW (*)    All other components within normal limits  LACTIC ACID, PLASMA    CT HEAD WO CONTRAST  Final Result      Medications  levETIRAcetam (KEPPRA) tablet 1,000 mg (1,000 mg Oral Given 09/25/18 1548)     Procedures Critical Care  ED Course and Medical Decision Making  I have reviewed the triage vital signs and the nursing notes.  Pertinent labs & imaging results that were available during my care of the patient were reviewed by me and considered in my medical decision making (see below for details).   Clinical Course as of Sep 25 1735  Mon Sep 25, 2018  1512 Question of provoked seizure in the setting of head trauma vs seizure causing fall--patient with h/o seizures but not taking her keppra   [MB]    Clinical Course User Index [MB] Maudie Flakes, MD    CT head is unremarkable, labs are reassuring, mild anemia compared to prior value on record from 2 years ago.  Patient denies any bleeding, normal colored stools.  Patient is feeling well and is requesting discharge.  Advised close PCP follow-up to discuss anemia, advised taking Keppra daily for seizure prevention until she can discuss this with her PCP.  There is some concern with Keppra use in the elderly, however given the presentation today and the inherent dangers of falls and head trauma, the benefit of prevention seems to outweigh the risks.  After the discussed management above, the patient was determined to be safe for discharge.  The patient was in agreement with this plan and all questions regarding their care were answered.  ED return precautions were discussed and the patient will return to the ED with any significant worsening of condition.  Barth Kirks. [place]onia Small, [place] [email or web address]  Final Clinical Impressions(s) / ED Diagnoses     ICD-10-CM   1. Seizure ([place]) R56.9     ED Discharge Orders         Ordered    levETIRAcetam (KEPPRA) 500 MG tablet  Daily     09/25/18 1737  Maudie Flakes, MD 09/25/18 [phone]

## 2018-09-25 NOTE — ED Triage Notes (Signed)
Ems reports pt was at her daughter's house and her daughter was washing her hair.  Reports pt fell backwards, head hit stove, and pt was shaking.  Daughter told ems it looked like a seizure.  Pt was also incontinent of urine.  When ems arrived pt was sitting in chair, alert, and oriented.  Reports pt had a seizure 4 years ago but was never told what caused it.  Pt not on seizure meds per ems.

## 2018-09-25 NOTE — ED Notes (Signed)
Spoke with daughters Candice and [place]. Explained results. One of the sisters will come to pick her mother.

## 2018-09-25 NOTE — Discharge Instructions (Signed)
You were evaluated in the Emergency Department and after careful evaluation, we did not find any emergent condition requiring admission or further testing in the hospital.  Your testing today was overall reassuring.  Your CT of your head was normal.  Your labs did not show any obvious causes of seizure today.  It did show [place] some mild anemia.  Please mention that your blood levels are low to your regular doctor.  We recommend that you restart the [place] medicine until you can be reevaluated by your primary care doctor.   Please return to the Emergency Department if you experience any worsening of your condition.  We encourage you to follow up with a primary care provider.  Thank you for allowing [place] to be a part of your care.

## 2019-04-10 ENCOUNTER — Encounter (HOSPITAL_COMMUNITY): Payer: Self-pay

## 2019-04-10 ENCOUNTER — Other Ambulatory Visit: Payer: Self-pay

## 2019-04-10 ENCOUNTER — Emergency Department (HOSPITAL_COMMUNITY)
Admission: EM | Admit: 2019-04-10 | Discharge: 2019-04-10 | Disposition: A | Discharge status: Home / Self Care | Payer: Medicare Other | Attending: Emergency Medicine | Admitting: Emergency Medicine

## 2019-04-10 DIAGNOSIS — Z79899 Other long term (current) drug therapy: Secondary | ICD-10-CM | POA: Insufficient documentation

## 2019-04-10 DIAGNOSIS — F0390 Unspecified dementia without behavioral disturbance: Secondary | ICD-10-CM | POA: Insufficient documentation

## 2019-04-10 DIAGNOSIS — I2510 Atherosclerotic heart disease of native coronary artery without angina pectoris: Secondary | ICD-10-CM | POA: Insufficient documentation

## 2019-04-10 DIAGNOSIS — R569 Unspecified convulsions: Secondary | ICD-10-CM | POA: Diagnosis present

## 2019-04-10 DIAGNOSIS — I10 Essential (primary) hypertension: Secondary | ICD-10-CM | POA: Insufficient documentation

## 2019-04-10 DIAGNOSIS — Z7982 Long term (current) use of aspirin: Secondary | ICD-10-CM | POA: Diagnosis not present

## 2019-04-10 DIAGNOSIS — E119 Type 2 diabetes mellitus without complications: Secondary | ICD-10-CM | POA: Insufficient documentation

## 2019-04-10 HISTORY — DX: Type 2 diabetes mellitus without complications: E11.9

## 2019-04-10 LAB — VITAMIN B12: Vitamin B-12: 280 pg/mL (ref 180–914)

## 2019-04-10 LAB — CBC WITH DIFFERENTIAL/PLATELET
Abs Immature Granulocytes: 0.05 10*3/uL (ref 0.00–0.07)
Basophils Absolute: 0.1 10*3/uL (ref 0.0–0.1)
Basophils Relative: 1 %
Eosinophils Absolute: 0 10*3/uL (ref 0.0–0.5)
Eosinophils Relative: 0 %
HCT: 30.1 % — ABNORMAL LOW (ref 36.0–46.0)
Hemoglobin: 7.7 g/dL — ABNORMAL LOW (ref 12.0–15.0)
Immature Granulocytes: 1 %
Lymphocytes Relative: 8 %
Lymphs Abs: 0.8 10*3/uL (ref 0.7–4.0)
MCH: 16 pg — ABNORMAL LOW (ref 26.0–34.0)
MCHC: 25.6 g/dL — ABNORMAL LOW (ref 30.0–36.0)
MCV: 62.6 fL — ABNORMAL LOW (ref 80.0–100.0)
Monocytes Absolute: 0.7 10*3/uL (ref 0.1–1.0)
Monocytes Relative: 8 %
Neutro Abs: 8 10*3/uL — ABNORMAL HIGH (ref 1.7–7.7)
Neutrophils Relative %: 82 %
Platelets: 360 10*3/uL (ref 150–400)
RBC: 4.81 MIL/uL (ref 3.87–5.11)
RDW: 21.8 % — ABNORMAL HIGH (ref 11.5–15.5)
WBC: 9.7 10*3/uL (ref 4.0–10.5)
nRBC: 0 % (ref 0.0–0.2)

## 2019-04-10 LAB — COMPREHENSIVE METABOLIC PANEL
ALT: 12 U/L (ref 0–44)
AST: 18 U/L (ref 15–41)
Albumin: 3.9 g/dL (ref 3.5–5.0)
Alkaline Phosphatase: 57 U/L (ref 38–126)
Anion gap: 10 (ref 5–15)
BUN: 18 mg/dL (ref 8–23)
CO2: 26 mmol/L (ref 22–32)
Calcium: 9.1 mg/dL (ref 8.9–10.3)
Chloride: 101 mmol/L (ref 98–111)
Creatinine, Ser: 2.96 mg/dL — ABNORMAL HIGH (ref 0.44–1.00)
GFR calc Af Amer: 17 mL/min — ABNORMAL LOW (ref >60)
GFR calc non Af Amer: 15 mL/min — ABNORMAL LOW (ref >60)
Glucose, Bld: 234 mg/dL — ABNORMAL HIGH (ref 70–99)
Potassium: 4.1 mmol/L (ref 3.5–5.1)
Sodium: 137 mmol/L (ref 135–145)
Total Bilirubin: 0.4 mg/dL (ref 0.3–1.2)
Total Protein: 8 g/dL (ref 6.5–8.1)

## 2019-04-10 LAB — IRON AND TIBC
Iron: 8 ug/dL — ABNORMAL LOW (ref 28–170)
Saturation Ratios: 2 % — ABNORMAL LOW (ref 10.4–31.8)
TIBC: 427 ug/dL (ref 250–450)
UIBC: 419 ug/dL

## 2019-04-10 LAB — RETICULOCYTES
Immature Retic Fract: 27.8 % — ABNORMAL HIGH (ref 2.3–15.9)
RBC.: 4.81 MIL/uL (ref 3.87–5.11)
Retic Count, Absolute: 68.8 10*3/uL (ref 19.0–186.0)
Retic Ct Pct: 1.4 % (ref 0.4–3.1)

## 2019-04-10 LAB — FERRITIN: Ferritin: 2 ng/mL — ABNORMAL LOW (ref 11–307)

## 2019-04-10 LAB — FOLATE: Folate: 11.8 ng/mL (ref >5.9)

## 2019-04-10 MED ORDER — SODIUM CHLORIDE 0.9 % IV BOLUS
1000.0000 mL | Freq: Once | INTRAVENOUS | Status: AC
  Administered 2019-04-10: 11:00:00 1000 mL via INTRAVENOUS

## 2019-04-10 MED ORDER — LEVETIRACETAM IN NACL 1000 MG/100ML IV SOLN
1000.0000 mg | Freq: Once | INTRAVENOUS | Status: AC
  Administered 2019-04-10: 11:00:00 1000 mg via INTRAVENOUS
  Filled 2019-04-10: qty 100

## 2019-04-10 MED ORDER — LEVETIRACETAM 500 MG PO TABS: 500.0000 mg | ORAL_TABLET | Freq: Two times a day (BID) | ORAL | 0 refills | Status: AC

## 2019-04-10 NOTE — ED Triage Notes (Signed)
EMS reports pt lives at home.  Son called ems because they think pt may have had a seizure.  Reports pt had an episode this morning where she "stared off into space."  Reports history of same.  CBG 260.  EMS started 22g iv in left hand and received a 253ml bolus.  bp 186/100, rr 16, hr 98 o2 sat 98% on room air.  Temp 97.5  Denies covid exposure.

## 2019-04-10 NOTE — Discharge Instructions (Addendum)
Increase your seizure medicine called Keppra so you are taking 1 pill twice a day.  Follow-up with your family doctor in the next couple weeks to see how you are doing with the seizures and also they need to check your blood pressure and your blood count is low

## 2019-04-10 NOTE — ED Provider Notes (Signed)
[facility] EMERGENCY DEPARTMENT Provider Note   CSN: [id number] Arrival date & time: 04/10/19  1044     History No chief complaint on file.   Sally Hardy is a 73 y.o. female.  Patient had episode where she was staring.  This seems to be her typical seizures.  She takes Keppra 5 mg once a day.  Patient is back to her normal  The history is provided by the patient and a relative. No language interpreter was used.  Seizures Seizure activity on arrival: yes   Seizure type:  Focal Preceding symptoms: no sensation of an aura present   Initial focality:  None Episode characteristics: no abnormal movements   Postictal symptoms: confusion   Return to baseline: yes   Severity:  Moderate Timing:  Once      Past Medical History:  Diagnosis Date  . Coronary artery disease   . Dementia ([place])   . Diabetes mellitus without complication ([place])    pt denies, ems reports pt does have history of DM  . Hypertension   . Renal disorder   . Stroke [facility])     There are no problems to display for this patient.   History reviewed. No pertinent surgical history.   OB History   No obstetric history on file.     No family history on file.  Social History   Tobacco Use  . Smoking status: Never Smoker  . Smokeless tobacco: Never Used  Substance Use Topics  . Alcohol use: No  . Drug use: No    Home Medications Prior to Admission medications   Medication Sig Start Date End Date Taking? Authorizing Provider  amLODipine (NORVASC) 10 MG tablet Take 10 mg by mouth every evening.    Yes [provider]  aspirin EC 81 MG tablet Take 81 mg by mouth every morning.    Yes [provider]  atorvastatin (LIPITOR) 20 MG tablet Take 20 mg by mouth every evening.    Yes [provider]  citalopram (CELEXA) 20 MG tablet Take 20 mg by mouth every morning.    Yes [provider]  cloNIDine (CATAPRES) 0.2 MG tablet Take 0.2 mg by mouth 2 (two) times daily.     Yes [provider]  donepezil (ARICEPT) 10 MG tablet Take 10 mg by mouth at bedtime.   Yes [provider]  famotidine (PEPCID) 20 MG tablet Take 20 mg by mouth 2 (two) times daily. 04/09/19  Yes [provider]  ferrous gluconate (FERGON) 324 MG tablet Take 324 mg by mouth every evening.   Yes [provider]  furosemide (LASIX) 20 MG tablet Take 20 mg by mouth every other day.   Yes [provider]  hydrALAZINE (APRESOLINE) 100 MG tablet Take 100 mg by mouth 2 (two) times a day.    Yes [provider]  levETIRAcetam (KEPPRA) 500 MG tablet Take 500 mg by mouth every 12 (twelve) hours.    Yes [provider]  levothyroxine (SYNTHROID) 50 MCG tablet Take 50 mcg by mouth daily before breakfast.   Yes [provider]  magnesium oxide (MAG-OX) 400 MG tablet Take 400 mg by mouth 2 (two) times daily.    Yes [provider]  metoprolol tartrate (LOPRESSOR) 50 MG tablet Take 50 mg by mouth 2 (two) times daily.   Yes [provider]  polyethylene glycol (HEALTHYLAX) 17 g packet Take 17 g by mouth daily as needed.    Yes [provider]  potassium chloride SA (K-DUR,KLOR-CON) 20 MEQ tablet Take 20 mEq by mouth every morning.    Yes [provider]  vitamin C (ASCORBIC ACID) 500 MG tablet Take 500 mg by mouth every evening.   Yes [provider]    Allergies    Patient has no known allergies.  Review of Systems   Review of Systems  Constitutional: Negative for appetite change and fatigue.  HENT: Negative for congestion, ear discharge and sinus pressure.   Eyes: Negative for discharge.  Respiratory: Negative for cough.   Cardiovascular: Negative for chest pain.  Gastrointestinal: Negative for abdominal pain and diarrhea.  Genitourinary: Negative for frequency and hematuria.  Musculoskeletal: Negative for back pain.  Skin: Negative for rash.  Neurological: Positive for seizures.  Negative for headaches.  Psychiatric/Behavioral: Negative for hallucinations.    Physical Exam Updated Vital Signs BP (!) 202/100   Pulse 96   Temp 98.5 F (36.9 C) (Oral)   Resp 15   Ht 5\' 7"  (1.702 m)   Wt 74 kg   SpO2 97%   BMI 25.55 kg/m   Physical Exam Vitals and nursing note reviewed.  Constitutional:      Appearance: She is well-developed.  HENT:     Head: Normocephalic.     Nose: Nose normal.  Eyes:     General: No scleral icterus.    Conjunctiva/sclera: Conjunctivae normal.  Neck:     Thyroid: No thyromegaly.  Cardiovascular:     Rate and Rhythm: Normal rate and regular rhythm.     Heart sounds: No murmur. No friction rub. No gallop.   Pulmonary:     Breath sounds: No stridor. No wheezing or rales.  Chest:     Chest wall: No tenderness.  Abdominal:     General: There is no distension.     Tenderness: There is no abdominal tenderness. There is no rebound.  Musculoskeletal:        General: Normal range of motion.     Cervical back: Neck supple.  Lymphadenopathy:     Cervical: No cervical adenopathy.  Skin:    Findings: No erythema or rash.  Neurological:     Mental Status: She is oriented to person, place, and time.     Motor: No abnormal muscle tone.     Coordination: Coordination normal.  Psychiatric:        Behavior: Behavior normal.     ED Results / Procedures / Treatments   Labs (all labs ordered are listed, but only abnormal results are displayed) Labs Reviewed  CBC WITH DIFFERENTIAL/PLATELET - Abnormal; Notable for the following components:      Result Value   Hemoglobin 7.7 (*)    HCT 30.1 (*)    MCV 62.6 (*)    MCH 16.0 (*)    MCHC 25.6 (*)    RDW 21.8 (*)    Neutro Abs 8.0 (*)    All other components within normal limits  COMPREHENSIVE METABOLIC PANEL - Abnormal; Notable for the following components:   Glucose, Bld 234 (*)    Creatinine, Ser 2.96 (*)    GFR calc non Af Amer 15 (*)    GFR calc Af Amer 17 (*)    All other  components within normal limits  VITAMIN B12  FOLATE  IRON AND TIBC  FERRITIN  RETICULOCYTES    EKG None  Radiology No results found.  Procedures Procedures (including critical care time)  Medications Ordered in ED Medications  levETIRAcetam (KEPPRA) IVPB 1000 mg/100  mL premix (0 mg Intravenous Stopped 04/10/19 1211)  sodium chloride 0.9 % bolus 1,000 mL (1,000 mLs Intravenous New Bag/Given 04/10/19 1119)    ED Course  I have reviewed the triage vital signs and the nursing notes.  Pertinent labs & imaging results that were available during my care of the patient were reviewed by me and considered in my medical decision making (see chart for details).    MDM Rules/Calculators/A&P                     Patient with a seizure.  We will increase her Keppra to 500 mg twice a day.  Patient also has hypertension and anemia that her primary care doctor is following.  She will follow-up with her primary care doctor to this check these things again. Final Clinical Impression(s) / ED Diagnoses Final diagnoses:  Seizure [facility])    Rx / [place] Orders ED Discharge Orders    None       Milton Ferguson, MD 04/10/19 1417

## 2020-02-07 ENCOUNTER — Telehealth: Payer: Self-pay

## 2020-02-07 NOTE — Telephone Encounter (Signed)
Volunteer check in call made for palliative care: No answer 

## 2020-02-27 ENCOUNTER — Telehealth: Payer: Self-pay

## 2020-02-27 NOTE — Telephone Encounter (Signed)
Volunteer call made on 02/13/20. No answer

## 2020-04-30 ENCOUNTER — Emergency Department (HOSPITAL_COMMUNITY)
Admission: EM | Admit: 2020-04-30 | Discharge: 2020-04-30 | Disposition: A | Discharge status: Home / Self Care | Payer: Medicare Other | Attending: Emergency Medicine | Admitting: Emergency Medicine

## 2020-04-30 ENCOUNTER — Other Ambulatory Visit: Payer: Self-pay

## 2020-04-30 ENCOUNTER — Encounter (HOSPITAL_COMMUNITY): Payer: Self-pay

## 2020-04-30 ENCOUNTER — Emergency Department (HOSPITAL_COMMUNITY): Payer: Medicare Other

## 2020-04-30 DIAGNOSIS — Z79899 Other long term (current) drug therapy: Secondary | ICD-10-CM | POA: Insufficient documentation

## 2020-04-30 DIAGNOSIS — R059 Cough, unspecified: Secondary | ICD-10-CM | POA: Diagnosis present

## 2020-04-30 DIAGNOSIS — E119 Type 2 diabetes mellitus without complications: Secondary | ICD-10-CM | POA: Insufficient documentation

## 2020-04-30 DIAGNOSIS — F0390 Unspecified dementia without behavioral disturbance: Secondary | ICD-10-CM | POA: Diagnosis not present

## 2020-04-30 DIAGNOSIS — Z7982 Long term (current) use of aspirin: Secondary | ICD-10-CM | POA: Diagnosis not present

## 2020-04-30 DIAGNOSIS — I2510 Atherosclerotic heart disease of native coronary artery without angina pectoris: Secondary | ICD-10-CM | POA: Insufficient documentation

## 2020-04-30 DIAGNOSIS — I10 Essential (primary) hypertension: Secondary | ICD-10-CM | POA: Diagnosis not present

## 2020-04-30 DIAGNOSIS — U071 COVID-19: Secondary | ICD-10-CM | POA: Diagnosis not present

## 2020-04-30 HISTORY — DX: Unspecified convulsions: R56.9

## 2020-04-30 LAB — RESP PANEL BY RT-PCR (FLU A&B, COVID) ARPGX2
Influenza A by PCR: NEGATIVE
Influenza B by PCR: NEGATIVE
SARS Coronavirus 2 by RT PCR: POSITIVE — AB

## 2020-04-30 MED ORDER — ALBUTEROL SULFATE HFA 108 (90 BASE) MCG/ACT IN AERS: 2.0000 | INHALATION_SPRAY | RESPIRATORY_TRACT | 0 refills | Status: DC | PRN

## 2020-04-30 MED ORDER — ALBUTEROL SULFATE HFA 108 (90 BASE) MCG/ACT IN AERS
4.0000 | INHALATION_SPRAY | Freq: Once | RESPIRATORY_TRACT | Status: AC
  Administered 2020-04-30: 4 via RESPIRATORY_TRACT
  Filled 2020-04-30: qty 6.7

## 2020-04-30 NOTE — ED Notes (Signed)
Call to daughter Freida Busman, she is aware that patient needs a ride home. She states she will call the patients boyfriend.

## 2020-04-30 NOTE — ED Triage Notes (Signed)
Pt brought in by EMS due to cough for 3 days. Pt has been taking OTC cough meds. Getting weaker Pt is not vaccinated

## 2020-04-30 NOTE — ED Provider Notes (Signed)
[facility] EMERGENCY DEPARTMENT Provider Note   CSN: [id number] Arrival date & time: 04/30/20  7106     History Chief Complaint  Patient presents with  . Cough    Sally Hardy is a 75 y.o. female.  Patient complains for cough for couple weeks  The history is provided by the spouse.  Cough Cough characteristics:  Dry Sputum characteristics:  Nondescript Severity:  Moderate Onset quality:  Sudden Timing:  Constant Progression:  Waxing and waning Chronicity:  New Context: not animal exposure   Relieved by:  Nothing Associated symptoms: no chest pain, no eye discharge, no headaches and no rash        Past Medical History:  Diagnosis Date  . Coronary artery disease   . Dementia ([place])   . Diabetes mellitus without complication ([place])    pt denies, ems reports pt does have history of DM  . Hypertension   . Renal disorder   . Seizures ([place])   . Stroke [facility])     There are no problems to display for this patient.   History reviewed. No pertinent surgical history.   OB History   No obstetric history on file.     No family history on file.  Social History   Tobacco Use  . Smoking status: Never Smoker  . Smokeless tobacco: Never Used  Substance Use Topics  . Alcohol use: No  . Drug use: No    Home Medications Prior to Admission medications   Medication Sig Start Date End Date Taking? Authorizing Provider  albuterol (VENTOLIN HFA) 108 (90 Base) MCG/ACT inhaler Inhale 2 puffs into the lungs every 4 (four) hours as needed for wheezing or shortness of breath. 04/30/20  Yes Milton Ferguson, MD  amLODipine (NORVASC) 10 MG tablet Take 10 mg by mouth every evening.     [provider]  aspirin EC 81 MG tablet Take 81 mg by mouth every morning.     [provider]  atorvastatin (LIPITOR) 20 MG tablet Take 20 mg by mouth every evening.     [provider]  citalopram (CELEXA) 20 MG tablet Take 20 mg by mouth every morning.     [provider]  cloNIDine (CATAPRES) 0.2 MG tablet Take 0.2 mg by mouth 2 (two) times daily.     [provider]  donepezil (ARICEPT) 10 MG tablet Take 10 mg by mouth at bedtime.    [provider]  famotidine (PEPCID) 20 MG tablet Take 20 mg by mouth 2 (two) times daily. 04/09/19   [provider]  ferrous gluconate (FERGON) 324 MG tablet Take 324 mg by mouth every evening.    [provider]  furosemide (LASIX) 20 MG tablet Take 20 mg by mouth every other day.    [provider]  hydrALAZINE (APRESOLINE) 100 MG tablet Take 100 mg by mouth 2 (two) times a day.     [provider]  levETIRAcetam (KEPPRA) 500 MG tablet Take 1 tablet (500 mg total) by mouth 2 (two) times daily. 04/10/19   Milton Ferguson, MD  levothyroxine (SYNTHROID) 50 MCG tablet Take 50 mcg by mouth daily before breakfast.    [provider]  magnesium oxide (MAG-OX) 400 MG tablet Take 400 mg by mouth 2 (two) times daily.     [provider]  metoprolol tartrate (LOPRESSOR) 50 MG tablet Take 50 mg by mouth 2 (two) times daily.    [provider]  polyethylene glycol (HEALTHYLAX) 17 g packet  Take 17 g by mouth daily as needed.     [provider]  potassium chloride SA (K-DUR,KLOR-CON) 20 MEQ tablet Take 20 mEq by mouth every morning.     [provider]  vitamin C (ASCORBIC ACID) 500 MG tablet Take 500 mg by mouth every evening.    [provider]    Allergies    Patient has no known allergies.  Review of Systems   Review of Systems  Constitutional: Negative for appetite change and fatigue.  HENT: Negative for congestion, ear discharge and sinus pressure.   Eyes: Negative for discharge.  Respiratory: Positive for cough.   Cardiovascular: Negative for chest pain.  Gastrointestinal: Negative for abdominal pain and diarrhea.  Genitourinary: Negative for frequency and hematuria.  Musculoskeletal: Negative for back pain.   Skin: Negative for rash.  Neurological: Negative for seizures and headaches.  Psychiatric/Behavioral: Negative for hallucinations.    Physical Exam Updated Vital Signs BP (!) 173/83   Pulse (!) 107   Temp 99.8 F (37.7 C)   Resp 18   Wt 68.5 kg   SpO2 96%   BMI 23.67 kg/m   Physical Exam Vitals and nursing note reviewed.  Constitutional:      Appearance: She is well-developed.  HENT:     Head: Normocephalic.     Nose: Nose normal.  Eyes:     General: No scleral icterus.    Extraocular Movements: EOM normal.     Conjunctiva/sclera: Conjunctivae normal.  Neck:     Thyroid: No thyromegaly.  Cardiovascular:     Rate and Rhythm: Normal rate and regular rhythm.     Heart sounds: No murmur heard. No friction rub. No gallop.   Pulmonary:     Breath sounds: No stridor. No wheezing or rales.  Chest:     Chest wall: No tenderness.  Abdominal:     General: There is no distension.     Tenderness: There is no abdominal tenderness. There is no rebound.  Musculoskeletal:        General: No edema. Normal range of motion.     Cervical back: Neck supple.  Lymphadenopathy:     Cervical: No cervical adenopathy.  Skin:    Findings: No erythema or rash.  Neurological:     Mental Status: She is alert and oriented to person, place, and time.     Motor: No abnormal muscle tone.     Coordination: Coordination normal.  Psychiatric:        Mood and Affect: Mood and affect normal.        Behavior: Behavior normal.     ED Results / Procedures / Treatments   Labs (all labs ordered are listed, but only abnormal results are displayed) Labs Reviewed  RESP PANEL BY RT-PCR (FLU A&B, COVID) ARPGX2 - Abnormal; Notable for the following components:      Result Value   SARS Coronavirus 2 by RT PCR POSITIVE (*)    All other components within normal limits    EKG None  Radiology DG Chest Portable 1 View  Result Date: 04/30/2020 CLINICAL DATA:  Cough. EXAM: PORTABLE CHEST 1 VIEW  COMPARISON:  01/29/2011. FINDINGS: Mediastinum hilar structures normal. Heart size stable. Low lung volumes with mild bibasilar subsegmental atelectasis. Mild left base infiltrate cannot be excluded. No pleural effusion or pneumothorax. Degenerative changes scoliosis thoracic spine. IMPRESSION: Low lung volumes with mild bibasilar subsegmental atelectasis. Mild left base infiltrate cannot be excluded. Electronically Signed   By: Marcello Moores  Register  On: 04/30/2020 09:11    Procedures Procedures (including critical care time)  Medications Ordered in ED Medications  albuterol (VENTOLIN HFA) 108 (90 Base) MCG/ACT inhaler 4 puff (4 puffs Inhalation Given 04/30/20 1145)    ED Course  I have reviewed the triage vital signs and the nursing notes.  Pertinent labs & imaging results that were available during my care of the patient were reviewed by me and considered in my medical decision making (see chart for details).    MDM Rules/Calculators/A&P                          Patient with mild cough.  She is Covid positive but not hypoxic when she ambulates she will be discharged home with albuterol and follow-up with PCP Final Clinical Impression(s) / ED Diagnoses Final diagnoses:  COVID    Rx / DC Orders ED Discharge Orders         Ordered    albuterol (VENTOLIN HFA) 108 (90 Base) MCG/ACT inhaler  Every 4 hours PRN        04/30/20 1323           Milton Ferguson, MD 05/01/20 1142

## 2020-04-30 NOTE — ED Notes (Signed)
Date and time results received: 04/30/20 1035 (use smartphrase ".now" to insert current time)  Test: COVID -19 Critical Value: positive  Name of Provider Notified: [place]  Orders Received? Or Actions Taken?: Orders Received - See Orders for details

## 2020-04-30 NOTE — Discharge Instructions (Signed)
Follow up with your md as needed °

## 2020-05-01 ENCOUNTER — Telehealth: Payer: Self-pay | Admitting: Gastroenterology

## 2020-05-01 ENCOUNTER — Observation Stay (HOSPITAL_COMMUNITY)
Admission: EM | Admit: 2020-05-01 | Discharge: 2020-05-02 | Disposition: A | Discharge status: Home / Self Care | Payer: Medicare Other | Attending: Internal Medicine | Admitting: Internal Medicine

## 2020-05-01 ENCOUNTER — Other Ambulatory Visit: Payer: Self-pay

## 2020-05-01 ENCOUNTER — Encounter (HOSPITAL_COMMUNITY): Payer: Self-pay | Admitting: Emergency Medicine

## 2020-05-01 DIAGNOSIS — R531 Weakness: Secondary | ICD-10-CM | POA: Diagnosis present

## 2020-05-01 DIAGNOSIS — U071 COVID-19: Secondary | ICD-10-CM | POA: Diagnosis not present

## 2020-05-01 DIAGNOSIS — I10 Essential (primary) hypertension: Secondary | ICD-10-CM | POA: Diagnosis present

## 2020-05-01 DIAGNOSIS — E119 Type 2 diabetes mellitus without complications: Secondary | ICD-10-CM | POA: Diagnosis not present

## 2020-05-01 DIAGNOSIS — N189 Chronic kidney disease, unspecified: Secondary | ICD-10-CM

## 2020-05-01 DIAGNOSIS — E039 Hypothyroidism, unspecified: Secondary | ICD-10-CM | POA: Insufficient documentation

## 2020-05-01 DIAGNOSIS — I129 Hypertensive chronic kidney disease with stage 1 through stage 4 chronic kidney disease, or unspecified chronic kidney disease: Secondary | ICD-10-CM | POA: Insufficient documentation

## 2020-05-01 DIAGNOSIS — I160 Hypertensive urgency: Secondary | ICD-10-CM | POA: Diagnosis present

## 2020-05-01 DIAGNOSIS — I2510 Atherosclerotic heart disease of native coronary artery without angina pectoris: Secondary | ICD-10-CM | POA: Diagnosis not present

## 2020-05-01 DIAGNOSIS — D649 Anemia, unspecified: Secondary | ICD-10-CM

## 2020-05-01 DIAGNOSIS — N184 Chronic kidney disease, stage 4 (severe): Secondary | ICD-10-CM | POA: Insufficient documentation

## 2020-05-01 DIAGNOSIS — Z8619 Personal history of other infectious and parasitic diseases: Secondary | ICD-10-CM | POA: Diagnosis not present

## 2020-05-01 DIAGNOSIS — G40909 Epilepsy, unspecified, not intractable, without status epilepticus: Secondary | ICD-10-CM

## 2020-05-01 DIAGNOSIS — D509 Iron deficiency anemia, unspecified: Secondary | ICD-10-CM | POA: Diagnosis not present

## 2020-05-01 DIAGNOSIS — F0390 Unspecified dementia without behavioral disturbance: Secondary | ICD-10-CM

## 2020-05-01 LAB — CBC WITH DIFFERENTIAL/PLATELET
Abs Immature Granulocytes: 0.05 10*3/uL (ref 0.00–0.07)
Basophils Absolute: 0 10*3/uL (ref 0.0–0.1)
Basophils Relative: 0 %
Eosinophils Absolute: 0 10*3/uL (ref 0.0–0.5)
Eosinophils Relative: 0 %
HCT: 23.1 % — ABNORMAL LOW (ref 36.0–46.0)
Hemoglobin: 5.6 g/dL — CL (ref 12.0–15.0)
Immature Granulocytes: 1 %
Lymphocytes Relative: 7 %
Lymphs Abs: 0.6 10*3/uL — ABNORMAL LOW (ref 0.7–4.0)
MCH: 14.3 pg — ABNORMAL LOW (ref 26.0–34.0)
MCHC: 24.2 g/dL — ABNORMAL LOW (ref 30.0–36.0)
MCV: 58.9 fL — ABNORMAL LOW (ref 80.0–100.0)
Monocytes Absolute: 0.9 10*3/uL (ref 0.1–1.0)
Monocytes Relative: 11 %
Neutro Abs: 6.8 10*3/uL (ref 1.7–7.7)
Neutrophils Relative %: 81 %
Platelets: 228 10*3/uL (ref 150–400)
RBC: 3.92 MIL/uL (ref 3.87–5.11)
RDW: 22.8 % — ABNORMAL HIGH (ref 11.5–15.5)
WBC: 8.4 10*3/uL (ref 4.0–10.5)
nRBC: 0.5 % — ABNORMAL HIGH (ref 0.0–0.2)

## 2020-05-01 LAB — TSH: TSH: 9.105 u[IU]/mL — ABNORMAL HIGH (ref 0.350–4.500)

## 2020-05-01 LAB — COMPREHENSIVE METABOLIC PANEL
ALT: 15 U/L (ref 0–44)
AST: 40 U/L (ref 15–41)
Albumin: 3.8 g/dL (ref 3.5–5.0)
Alkaline Phosphatase: 60 U/L (ref 38–126)
Anion gap: 14 (ref 5–15)
BUN: 32 mg/dL — ABNORMAL HIGH (ref 8–23)
CO2: 24 mmol/L (ref 22–32)
Calcium: 8.8 mg/dL — ABNORMAL LOW (ref 8.9–10.3)
Chloride: 101 mmol/L (ref 98–111)
Creatinine, Ser: 3.71 mg/dL — ABNORMAL HIGH (ref 0.44–1.00)
GFR, Estimated: 12 mL/min — ABNORMAL LOW (ref >60)
Glucose, Bld: 163 mg/dL — ABNORMAL HIGH (ref 70–99)
Potassium: 4.8 mmol/L (ref 3.5–5.1)
Sodium: 139 mmol/L (ref 135–145)
Total Bilirubin: 0.6 mg/dL (ref 0.3–1.2)
Total Protein: 7.7 g/dL (ref 6.5–8.1)

## 2020-05-01 LAB — PREPARE RBC (CROSSMATCH)

## 2020-05-01 LAB — HEMOGLOBIN A1C
Hgb A1c MFr Bld: 6.6 % — ABNORMAL HIGH (ref 4.8–5.6)
Mean Plasma Glucose: 142.72 mg/dL

## 2020-05-01 LAB — TROPONIN I (HIGH SENSITIVITY): Troponin I (High Sensitivity): 34 ng/L — ABNORMAL HIGH (ref <18)

## 2020-05-01 LAB — MAGNESIUM: Magnesium: 2.3 mg/dL (ref 1.7–2.4)

## 2020-05-01 LAB — POC OCCULT BLOOD, ED: Fecal Occult Bld: POSITIVE — AB

## 2020-05-01 LAB — ABO/RH: ABO/RH(D): B POS

## 2020-05-01 MED ORDER — METOPROLOL TARTRATE 50 MG PO TABS
50.0000 mg | ORAL_TABLET | Freq: Two times a day (BID) | ORAL | Status: DC
  Administered 2020-05-01 – 2020-05-02 (×2): 50 mg via ORAL
  Filled 2020-05-01 (×2): qty 1

## 2020-05-01 MED ORDER — ONDANSETRON HCL 4 MG PO TABS: 4.0000 mg | ORAL_TABLET | Freq: Four times a day (QID) | ORAL | Status: DC | PRN

## 2020-05-01 MED ORDER — ONDANSETRON HCL 4 MG/2ML IJ SOLN: 4.0000 mg | Freq: Four times a day (QID) | INTRAMUSCULAR | Status: DC | PRN

## 2020-05-01 MED ORDER — HYDRALAZINE HCL 25 MG PO TABS
100.0000 mg | ORAL_TABLET | Freq: Two times a day (BID) | ORAL | Status: DC
Start: 2020-05-01 — End: 2020-05-02
  Administered 2020-05-01 – 2020-05-02 (×2): 100 mg via ORAL
  Filled 2020-05-01 (×2): qty 4

## 2020-05-01 MED ORDER — DONEPEZIL HCL 5 MG PO TABS
10.0000 mg | ORAL_TABLET | Freq: Every day | ORAL | Status: DC
  Administered 2020-05-01: 10 mg via ORAL
  Filled 2020-05-01: qty 2

## 2020-05-01 MED ORDER — CITALOPRAM HYDROBROMIDE 20 MG PO TABS
20.0000 mg | ORAL_TABLET | Freq: Every morning | ORAL | Status: DC
  Administered 2020-05-02: 20 mg via ORAL
  Filled 2020-05-01: qty 1

## 2020-05-01 MED ORDER — ACETAMINOPHEN 650 MG RE SUPP: 650.0000 mg | Freq: Four times a day (QID) | RECTAL | Status: DC | PRN

## 2020-05-01 MED ORDER — LEVETIRACETAM 500 MG PO TABS
500.0000 mg | ORAL_TABLET | Freq: Two times a day (BID) | ORAL | Status: DC
  Administered 2020-05-01 – 2020-05-02 (×2): 500 mg via ORAL
  Filled 2020-05-01 (×2): qty 1

## 2020-05-01 MED ORDER — FUROSEMIDE 20 MG PO TABS
20.0000 mg | ORAL_TABLET | ORAL | Status: DC
  Filled 2020-05-01: qty 1

## 2020-05-01 MED ORDER — SODIUM CHLORIDE 0.9 % IV BOLUS
1000.0000 mL | Freq: Once | INTRAVENOUS | Status: AC
  Administered 2020-05-01: 1000 mL via INTRAVENOUS

## 2020-05-01 MED ORDER — FAMOTIDINE 20 MG PO TABS
10.0000 mg | ORAL_TABLET | Freq: Every day | ORAL | Status: DC
  Administered 2020-05-01: 10 mg via ORAL
  Filled 2020-05-01: qty 1

## 2020-05-01 MED ORDER — ATORVASTATIN CALCIUM 20 MG PO TABS
20.0000 mg | ORAL_TABLET | Freq: Every evening | ORAL | Status: DC
  Administered 2020-05-01: 20 mg via ORAL
  Filled 2020-05-01: qty 2

## 2020-05-01 MED ORDER — AMLODIPINE BESYLATE 5 MG PO TABS
10.0000 mg | ORAL_TABLET | Freq: Every evening | ORAL | Status: DC
Start: 2020-05-01 — End: 2020-05-02
  Administered 2020-05-01: 10 mg via ORAL
  Filled 2020-05-01: qty 2

## 2020-05-01 MED ORDER — SODIUM CHLORIDE 0.9 % IV SOLN
10.0000 mL/h | Freq: Once | INTRAVENOUS | Status: AC
  Administered 2020-05-01: 10 mL/h via INTRAVENOUS

## 2020-05-01 MED ORDER — ACETAMINOPHEN 325 MG PO TABS: 650.0000 mg | ORAL_TABLET | Freq: Four times a day (QID) | ORAL | Status: DC | PRN

## 2020-05-01 MED ORDER — FAMOTIDINE 20 MG PO TABS: 20.0000 mg | ORAL_TABLET | Freq: Two times a day (BID) | ORAL | Status: DC

## 2020-05-01 NOTE — ED Notes (Signed)
Date and time results received: 05/01/20 1222   Test: Hemoglobin Critical Value: 5.6  Name of Provider Notified: Dr. Langston Masker  Orders Received? Or Actions Taken?: No new orders given.

## 2020-05-01 NOTE — H&P (Signed)
History and Physical  Sally Hardy WNU:[id number] DOB: 10-02-1945 DOA: 05/01/2020  Referring physician: Myrtie Cruise, ER physician PCP: [facility]  Outpatient Specialists: None Patient coming from: Home & is able to ambulate with use of a cane  Chief Complaint: Weakness  HPI: Sally Hardy is a 75 y.o. female with medical history significant for mild dementia, seizure disorder, stage IV chronic kidney disease and hypertension who presents to the emergency room after a fall.  Patient reports generalized weakness starting 2 days ago.  When she had a hard time getting up, EMS was called and patient was brought in.  She reports not being vaccinated.   ED Course: Patient tested positive for Covid in the emergency room although she is not hypoxic.  However, lab work noted BUN of 32/creatinine of 3.71 with a previous creatinine of 2.  9 6 a year ago.  Hemoglobin a year ago was 7.7 with MCV of 63 and today her hemoglobin is 5.6 with an MCV of 59.  Patient was Hemoccult positive but no signs of volume depletion.  Rather, she was noted to have hypertensive urgency with blood pressure of 177/83 and heart rate of 64 on admission.  Case was discussed with gastroenterology.  Because she did not appear to be having a definitive active bleed and because of her positive Covid status, GI recommended outpatient work-up following stabilization.  2 units packed red blood cells were ordered for patient and hospitalist were called for further evaluation.  Review of Systems: Patient seen in the emergency room. Pt complains of feeling tired  Pt denies any headaches, vision changes, dysphagia, chest pain, palpitations, shortness of breath, wheezing, cough, abdominal pain, hematuria, blood in her stool, constipation or diarrhea, focal extremity numbness or pain.  Review of systems are otherwise negative   Past Medical History:  Diagnosis Date  . Coronary artery disease   . Dementia ([place])   .  Diabetes mellitus without complication ([place])    pt denies, ems reports pt does have history of DM  . Hypertension   . Renal disorder   . Seizures ([place])   . Stroke [facility])    History reviewed. No pertinent surgical history.  Social History:  reports that she has never smoked. She has never used smokeless tobacco. She reports that she does not drink alcohol and does not use drugs.  Lives at home with family.  Ambulates using a cane   No Known Allergies   Family history: High blood pressure runs in her family  Prior to Admission medications   Medication Sig Start Date End Date Taking? Authorizing Provider  amLODipine (NORVASC) 10 MG tablet Take 10 mg by mouth every evening.    Yes [provider]  aspirin EC 81 MG tablet Take 81 mg by mouth every morning.    Yes [provider]  atorvastatin (LIPITOR) 20 MG tablet Take 20 mg by mouth every evening.    Yes [provider]  citalopram (CELEXA) 20 MG tablet Take 20 mg by mouth every morning.    Yes [provider]  cloNIDine (CATAPRES) 0.2 MG tablet Take 0.2 mg by mouth 3 (three) times daily.   Yes [provider]  donepezil (ARICEPT) 10 MG tablet Take 10 mg by mouth at bedtime.   Yes [provider]  famotidine (PEPCID) 20 MG tablet Take 20 mg by mouth 2 (two) times daily. 04/09/19  Yes [provider]  ferrous gluconate (FERGON) 324 MG tablet Take  324 mg by mouth every evening.   Yes [provider]  furosemide (LASIX) 20 MG tablet Take 20 mg by mouth every other day.   Yes [provider]  hydrALAZINE (APRESOLINE) 100 MG tablet Take 100 mg by mouth 2 (two) times a day.    Yes [provider]  levETIRAcetam (KEPPRA) 500 MG tablet Take 1 tablet (500 mg total) by mouth 2 (two) times daily. 04/10/19  Yes Milton Ferguson, MD  magnesium oxide (MAG-OX) 400 MG tablet Take 400 mg by mouth 2 (two) times daily.    Yes [provider]  metoprolol tartrate  (LOPRESSOR) 50 MG tablet Take 50 mg by mouth 2 (two) times daily.   Yes [provider]  potassium chloride SA (K-DUR,KLOR-CON) 20 MEQ tablet Take 20 mEq by mouth every morning.    Yes [provider]  vitamin C (ASCORBIC ACID) 500 MG tablet Take 500 mg by mouth every evening.   Yes [provider]  levothyroxine (SYNTHROID) 50 MCG tablet Take 50 mcg by mouth daily before breakfast. Patient not taking: No sig reported    [provider]  polyethylene glycol (MIRALAX / GLYCOLAX) 17 g packet Take 17 g by mouth daily as needed for mild constipation.    [provider]    Physical Exam: BP (!) 195/94   Pulse 71   Temp 98.1 F (36.7 C) (Oral)   Resp (!) 22   SpO2 94%   General: Alert and oriented x2, no acute distress Eyes: Sclera nonicteric, extraocular movements are intact ENT: Normocephalic and atraumatic, mucous membranes are slightly dry Neck: Supple, no JVD Cardiovascular: Regular rate and rhythm, S1-S2 Respiratory: Clear to auscultation bilaterally Abdomen: Soft, nontender, nondistended, positive bowel sounds Skin: No skin breaks, tears or lesions Musculoskeletal: No clubbing or cyanosis or edema Psychiatric: Appropriate, no evidence of psychoses Neurologic: No focal deficits          Labs on Admission:  Basic Metabolic Panel: Recent Labs  Lab 05/01/20 1144  NA 139  K 4.8  CL 101  CO2 24  GLUCOSE 163*  BUN 32*  CREATININE 3.71*  CALCIUM 8.8*  MG 2.3   Liver Function Tests: Recent Labs  Lab 05/01/20 1144  AST 40  ALT 15  ALKPHOS 60  BILITOT 0.6  PROT 7.7  ALBUMIN 3.8   No results for input(s): LIPASE, AMYLASE in the last 168 hours. No results for input(s): AMMONIA in the last 168 hours. CBC: Recent Labs  Lab 05/01/20 1144  WBC 8.4  NEUTROABS 6.8  HGB 5.6*  HCT 23.1*  MCV 58.9*  PLT 228   Cardiac Enzymes: No results for input(s): CKTOTAL, CKMB, CKMBINDEX, TROPONINI in the last 168 hours.  BNP (last 3  results) No results for input(s): BNP in the last 8760 hours.  ProBNP (last 3 results) No results for input(s): PROBNP in the last 8760 hours.  CBG: No results for input(s): GLUCAP in the last 168 hours.  Radiological Exams on Admission: DG Chest Portable 1 View  Result Date: 04/30/2020 CLINICAL DATA:  Cough. EXAM: PORTABLE CHEST 1 VIEW COMPARISON:  01/29/2011. FINDINGS: Mediastinum hilar structures normal. Heart size stable. Low lung volumes with mild bibasilar subsegmental atelectasis. Mild left base infiltrate cannot be excluded. No pleural effusion or pneumothorax. Degenerative changes scoliosis thoracic spine. IMPRESSION: Low lung volumes with mild bibasilar subsegmental atelectasis. Mild left base infiltrate cannot be excluded. Electronically Signed   By: Marcello Moores  Register   On: 04/30/2020 09:11    EKG: Independently  reviewed.  Sinus rhythm with nonspecific repolarization abnormalities diffusely  Assessment/Plan Present on Admission: . Coronary artery disease . 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 Hypertension . CKD (chronic kidney disease), stage IV ([place]) . Iron deficiency anemia . Hypertensive urgency . COVID-19 virus infection  Principal Problem:   Iron deficiency anemia: Heme positive, so it is still possible that she may have had an underlying subacute bleed, especially describing symptoms that started 2 days ago.  On the other hand, it may have been her Covid that started a few days ago.  We will confirm that to see if she is ever had a colonoscopy as this could be an occult malignancy as well.  We will check a CEA level.  Getting 2 units packed red blood cells and will start iron in the morning. Active Problems:   Coronary artery disease: Stable.    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  Hypothyroid?:  According to family member, Synthroid is one of her medications although cannot be confirmed by  other family members and not on medication profile.  Will check TSH.    Diabetes mellitus without complication [facility]): Reported history although looks to be diet controlled.  Will check A1c.  Patient not on any home medications.     CKD (chronic kidney disease), stage IV ([place]): BUN/creatinine may be higher, GFR essentially unchanged.  This may be more chronic in nature.    Seizure disorder ([place]): Continue Keppra.  Hypertension patient with hypertensive urgency: Patient may have some underlying heart failure.  Following transfusion we will diurese and check BNP.    COVID-19 virus infection: Looks to be asymptomatic for now, thank goodness.  Will keep in isolation and monitor closely.  If she becomes minimally hypoxic, may benefit from some IV Remdisivir.  Because she is an inpatient at this time, would not qualify for antibody infusion, however can look at this or oral treatment medication upon discharge.   DVT prophylaxis: SCDs  Code Status: Full code  Family Communication: Updated daughter by phone  Disposition Plan: Potential discharge tomorrow if hemoglobin stable, not hypoxic  Consults called: Case discussed with gastroenterology  Admission status: Given expectation the patient may be discharged tomorrow, placed under observation    Annita Brod MD [facility] Pager [phone]  If 7PM-7AM, please contact night-coverage [email or web address] Password [facility]  05/01/2020, 3:57 PM

## 2020-05-01 NOTE — Telephone Encounter (Signed)
Patient scheduled and ER nurse will tell patient date and time

## 2020-05-01 NOTE — ED Notes (Addendum)
2nd unit of PRBCs complete. Pt resting comfortably at this time.

## 2020-05-01 NOTE — Telephone Encounter (Signed)
Please arrange for office visit in 2-3 weeks for IDA, heme + stool. Patient currently inpatient. covid +.

## 2020-05-01 NOTE — ED Provider Notes (Signed)
[facility] EMERGENCY DEPARTMENT Provider Note   CSN: [id number] Arrival date & time: 05/01/20  1119     History Chief Complaint  Patient presents with  . Fall    [facility] is a 75 y.o. female history of renal disorder, diabetes, coronary artery disease, some dementia, present emergency department with weakness and a fall.  The patient was seen at emergency department yesterday and tested positive for COVID at that time.  She reports she is not vaccinated.  She was noted to be not hypoxic, and was discharged home with her family.  She lives with her son.  She reports that today she was trying to use the bathroom, walking to the bathroom she continued to feel extremely weak and fell in the bathroom.  She had a hard time getting up.  Family called EMS to bring her to the department with concern for worsening weakness.  She reports a poor appetite at home.  She states that her symptoms began approximately 2 days ago.  Currently she denies lightheadedness, headache, chest pain, cough, abdominal pain.  HPI     Past Medical History:  Diagnosis Date  . Coronary artery disease   . Dementia ([place])   . Diabetes mellitus without complication ([place])    pt denies, ems reports pt does have history of DM  . Hypertension   . Renal disorder   . Seizures ([place])   . Stroke [facility])     There are no problems to display for this patient.   History reviewed. No pertinent surgical history.   OB History   No obstetric history on file.     History reviewed. No pertinent family history.  Social History   Tobacco Use  . Smoking status: Never Smoker  . Smokeless tobacco: Never Used  Substance Use Topics  . Alcohol use: No  . Drug use: No    Home Medications Prior to Admission medications   Medication Sig Start Date End Date Taking? Authorizing Provider  albuterol (VENTOLIN HFA) 108 (90 Base) MCG/ACT inhaler Inhale 2 puffs into the lungs every 4 (four) hours as needed for wheezing or  shortness of breath. 04/30/20   Milton Ferguson, MD  amLODipine (NORVASC) 10 MG tablet Take 10 mg by mouth every evening.     [provider]  aspirin EC 81 MG tablet Take 81 mg by mouth every morning.     [provider]  atorvastatin (LIPITOR) 20 MG tablet Take 20 mg by mouth every evening.     [provider]  citalopram (CELEXA) 20 MG tablet Take 20 mg by mouth every morning.     [provider]  cloNIDine (CATAPRES) 0.2 MG tablet Take 0.2 mg by mouth 2 (two) times daily.     [provider]  donepezil (ARICEPT) 10 MG tablet Take 10 mg by mouth at bedtime.    [provider]  famotidine (PEPCID) 20 MG tablet Take 20 mg by mouth 2 (two) times daily. 04/09/19   [provider]  ferrous gluconate (FERGON) 324 MG tablet Take 324 mg by mouth every evening.    [provider]  furosemide (LASIX) 20 MG tablet Take 20 mg by mouth every other day.    [provider]  hydrALAZINE (APRESOLINE) 100 MG tablet Take 100 mg by mouth 2 (two) times a day.     [provider]  levETIRAcetam (KEPPRA) 500 MG tablet Take 1 tablet (500 mg total) by mouth 2 (two) times daily. 04/10/19  Milton Ferguson, MD  levothyroxine (SYNTHROID) 50 MCG tablet Take 50 mcg by mouth daily before breakfast.    [provider]  magnesium oxide (MAG-OX) 400 MG tablet Take 400 mg by mouth 2 (two) times daily.     [provider]  metoprolol tartrate (LOPRESSOR) 50 MG tablet Take 50 mg by mouth 2 (two) times daily.    [provider]  polyethylene glycol (HEALTHYLAX) 17 g packet Take 17 g by mouth daily as needed.     [provider]  potassium chloride SA (K-DUR,KLOR-CON) 20 MEQ tablet Take 20 mEq by mouth every morning.     [provider]  vitamin C (ASCORBIC ACID) 500 MG tablet Take 500 mg by mouth every evening.    [provider]    Allergies    Patient has no known allergies.  Review of  Systems   Review of Systems  Constitutional: Negative for chills and fever.  HENT: Negative for ear pain and sore throat.   Eyes: Negative for pain and visual disturbance.  Respiratory: Negative for cough and shortness of breath.   Cardiovascular: Negative for chest pain and palpitations.  Gastrointestinal: Negative for abdominal pain and vomiting.  Genitourinary: Negative for dysuria and hematuria.  Musculoskeletal: Positive for arthralgias and myalgias.  Skin: Negative for color change and rash.  Neurological: Negative for syncope and numbness.  All other systems reviewed and are negative.   Physical Exam Updated Vital Signs BP (!) 155/78   Pulse 63   Temp 98.5 F (36.9 C) (Oral)   Resp 16   SpO2 94%   Physical Exam Vitals and nursing note reviewed.  Constitutional:      General: She is not in acute distress.    Appearance: She is well-developed and well-nourished.  HENT:     Head: Normocephalic and atraumatic.  Eyes:     Conjunctiva/sclera: Conjunctivae normal.  Cardiovascular:     Rate and Rhythm: Normal rate and regular rhythm.     Pulses: Normal pulses.  Pulmonary:     Effort: Pulmonary effort is normal. No respiratory distress.     Breath sounds: Normal breath sounds.  Abdominal:     Palpations: Abdomen is soft.     Tenderness: There is no abdominal tenderness.  Musculoskeletal:        General: No edema.     Cervical back: Neck supple.  Skin:    General: Skin is warm and dry.  Neurological:     General: No focal deficit present.     Mental Status: She is alert and oriented to person, place, and time.  Psychiatric:        Mood and Affect: Mood and affect normal.     ED Results / Procedures / Treatments   Labs (all labs ordered are listed, but only abnormal results are displayed) Labs Reviewed  COMPREHENSIVE METABOLIC PANEL - Abnormal; Notable for the following components:      Result Value   Glucose, Bld 163 (*)    BUN 32 (*)    Creatinine, Ser  3.71 (*)    Calcium 8.8 (*)    GFR, Estimated 12 (*)    All other components within normal limits  CBC WITH DIFFERENTIAL/PLATELET - Abnormal; Notable for the following components:   Hemoglobin 5.6 (*)    HCT 23.1 (*)    MCV 58.9 (*)    MCH 14.3 (*)    MCHC 24.2 (*)    RDW 22.8 (*)    nRBC 0.5 (*)  Lymphs Abs 0.6 (*)    All other components within normal limits  POC OCCULT BLOOD, ED - Abnormal; Notable for the following components:   Fecal Occult Bld POSITIVE (*)    All other components within normal limits  TROPONIN I (HIGH SENSITIVITY) - Abnormal; Notable for the following components:   Troponin I (High Sensitivity) 34 (*)    All other components within normal limits  MAGNESIUM  TYPE AND SCREEN  PREPARE RBC (CROSSMATCH)  ABO/RH    EKG EKG Interpretation  Date/Time:  Thursday May 01 2020 11:23:16 EST Ventricular Rate:  65 PR Interval:    QRS Duration: 89 QT Interval:  441 QTC Calculation: 459 R Axis:   52 Text Interpretation: Sinus rhythm Consider left atrial enlargement Abnormal R-wave progression, early transition Repol abnrm suggests ischemia, diffuse leads No STEMI Confirmed by Octaviano Glow [phone]) on 05/01/2020 11:31:22 AM   Radiology DG Chest Portable 1 View  Result Date: 04/30/2020 CLINICAL DATA:  Cough. EXAM: PORTABLE CHEST 1 VIEW COMPARISON:  01/29/2011. FINDINGS: Mediastinum hilar structures normal. Heart size stable. Low lung volumes with mild bibasilar subsegmental atelectasis. Mild left base infiltrate cannot be excluded. No pleural effusion or pneumothorax. Degenerative changes scoliosis thoracic spine. IMPRESSION: Low lung volumes with mild bibasilar subsegmental atelectasis. Mild left base infiltrate cannot be excluded. Electronically Signed   By: Marcello Moores  Register   On: 04/30/2020 09:11    Procedures .Critical Care Performed by: Wyvonnia Dusky, MD Authorized by: Wyvonnia Dusky, MD   Critical care provider statement:    Critical care time  (minutes):  45   Critical care was necessary to treat or prevent imminent or life-threatening deterioration of the following conditions:  Circulatory failure   Critical care was time spent personally by me on the following activities:  Discussions with consultants, evaluation of patient's response to treatment, examination of patient, ordering and performing treatments and interventions, ordering and review of laboratory studies, ordering and review of radiographic studies, pulse oximetry, re-evaluation of patient's condition, obtaining history from patient or surrogate and review of old charts Comments:     Symptomatic anemia requiring IV blood transfusion   (including critical care time)  Medications Ordered in ED Medications  0.9 %  sodium chloride infusion (has no administration in time range)  sodium chloride 0.9 % bolus 1,000 mL (1,000 mLs Intravenous New Bag/Given 05/01/20 1153)    ED Course  I have reviewed the triage vital signs and the nursing notes.  Pertinent labs & imaging results that were available during my care of the patient were reviewed by me and considered in my medical decision making (see chart for details).  75 year old female here with day 2 of COVID symptoms, not vaccinated for COVID, with worsening fatigue and a fall at home today.  This appears to be due to weakness.  There is no syncope reported.  We will check her labs and her hydration status, as well as her hemoglobin level.  We can give her some gentle fluids here.  Will reassess.  Daughter candiace is PoA at [phone] - please call with any medical questions - she confirms patient is FULL CODE at this time.  *  Update - labs show significant microcytic anemia.  Hemoccult positive.  Suspect GI source for anemia. She's stable here, but with hgb this low I've ordered 2 units PRBC.  Patient and family consented for transfusion.  Will discuss with GI team and admit to hospitalist  Labs otherwise show CKD  with Cr  3.7 (near baseline), WBC 8.4, platelets 228.  Trop only mildly elevated at 34 - doubt this is ACS.  Mg 2.3.  IV fluids ordered for dehydration, IV pRBC 2 units for anemia  Prior medical charts reviewed - additional history obtained from family No known hx of GI bleed.  No GI doctor on file.    *  TASHINA CREDIT was evaluated in Emergency Department on 05/01/2020 for the symptoms described in the history of present illness. She was evaluated in the context of the global COVID-19 pandemic, which necessitated consideration that the patient might be at risk for infection with the SARS-CoV-2 virus that causes COVID-19. Institutional protocols and algorithms that pertain to the evaluation of patients at risk for COVID-19 are in a state of rapid change based on information released by regulatory bodies including the CDC and federal and state organizations. These policies and algorithms were followed during the patient's care in the ED.   Clinical Course as of 05/01/20 1420  Thu May 01, 2020  1241 Hemoglobin(!!): 5.6 [MT]  1245 No answer from daughter 44.  Patient consented for blood transfusion [MT]  59 Spoke to daughter Ihor Gully - who is PoA - and she confirms blood transfusion and admission. [MT]  1300 Hemoccult is positive, no gross melena [MT]  3 Spoke to GI service with Dr Vale Haven PA, consult placed for suspected GI bleed.  Informed pt remains Covid positive on precautions. [MT]  1416 Signed out to Dr [place] Pink hospitalist [MT]    Clinical Course User Index [MT] Langston Masker, Carola Rhine, MD    Final Clinical Impression(s) / ED Diagnoses Final diagnoses:  Symptomatic anemia  COVID-19  Weakness  Chronic kidney disease, unspecified CKD stage    Rx / DC Orders ED Discharge Orders    None       Wyvonnia Dusky, MD 05/01/20 1420

## 2020-05-01 NOTE — ED Notes (Signed)
Pt cleaned, new linen provided and purewick in place.

## 2020-05-01 NOTE — ED Notes (Signed)
Blood transfusion will not scan correctly. Blood verified by this nurse, [facility] (Blood Bank) and Lamount Cranker, RN. Blood bank aware and says it is safe to continue administration

## 2020-05-01 NOTE — ED Triage Notes (Signed)
Pt fell today in while walking. Denies any injuries. Family wanted pt to be seen. Denies any pain. Pt states " im weak from having covid"   Tested COVID + 2 days ago

## 2020-05-02 ENCOUNTER — Encounter (HOSPITAL_COMMUNITY): Payer: Self-pay | Admitting: Internal Medicine

## 2020-05-02 DIAGNOSIS — I160 Hypertensive urgency: Secondary | ICD-10-CM | POA: Diagnosis not present

## 2020-05-02 DIAGNOSIS — N184 Chronic kidney disease, stage 4 (severe): Secondary | ICD-10-CM | POA: Diagnosis not present

## 2020-05-02 DIAGNOSIS — E039 Hypothyroidism, unspecified: Secondary | ICD-10-CM | POA: Diagnosis present

## 2020-05-02 DIAGNOSIS — U071 COVID-19: Secondary | ICD-10-CM | POA: Diagnosis not present

## 2020-05-02 DIAGNOSIS — D509 Iron deficiency anemia, unspecified: Secondary | ICD-10-CM | POA: Diagnosis not present

## 2020-05-02 LAB — TYPE AND SCREEN
ABO/RH(D): B POS
Antibody Screen: NEGATIVE
Unit division: 0
Unit division: 0

## 2020-05-02 LAB — BASIC METABOLIC PANEL
Anion gap: 11 (ref 5–15)
BUN: 31 mg/dL — ABNORMAL HIGH (ref 8–23)
CO2: 25 mmol/L (ref 22–32)
Calcium: 8.8 mg/dL — ABNORMAL LOW (ref 8.9–10.3)
Chloride: 103 mmol/L (ref 98–111)
Creatinine, Ser: 3.39 mg/dL — ABNORMAL HIGH (ref 0.44–1.00)
GFR, Estimated: 13 mL/min — ABNORMAL LOW (ref >60)
Glucose, Bld: 124 mg/dL — ABNORMAL HIGH (ref 70–99)
Potassium: 4.6 mmol/L (ref 3.5–5.1)
Sodium: 139 mmol/L (ref 135–145)

## 2020-05-02 LAB — BPAM RBC
Blood Product Expiration Date: 202201292359
Blood Product Expiration Date: 202202072359
ISSUE DATE / TIME: 202201061518
ISSUE DATE / TIME: 202201061827
Unit Type and Rh: 1700
Unit Type and Rh: 5100

## 2020-05-02 LAB — CBC
HCT: 34.2 % — ABNORMAL LOW (ref 36.0–46.0)
Hemoglobin: 9.3 g/dL — ABNORMAL LOW (ref 12.0–15.0)
MCH: 18.2 pg — ABNORMAL LOW (ref 26.0–34.0)
MCHC: 27.2 g/dL — ABNORMAL LOW (ref 30.0–36.0)
MCV: 67.1 fL — ABNORMAL LOW (ref 80.0–100.0)
Platelets: 207 10*3/uL (ref 150–400)
RBC: 5.1 MIL/uL (ref 3.87–5.11)
RDW: 31.5 % — ABNORMAL HIGH (ref 11.5–15.5)
WBC: 6.9 10*3/uL (ref 4.0–10.5)
nRBC: 0.6 % — ABNORMAL HIGH (ref 0.0–0.2)

## 2020-05-02 MED ORDER — INFLUENZA VAC A&B SA ADJ QUAD 0.5 ML IM PRSY: 0.5000 mL | PREFILLED_SYRINGE | INTRAMUSCULAR | Status: DC

## 2020-05-02 MED ORDER — GUAIFENESIN-DM 100-10 MG/5ML PO SYRP
5.0000 mL | ORAL_SOLUTION | ORAL | Status: DC | PRN
  Administered 2020-05-02: 5 mL via ORAL
  Filled 2020-05-02: qty 5

## 2020-05-02 MED ORDER — BENZONATATE 100 MG PO CAPS: 200.0000 mg | ORAL_CAPSULE | Freq: Three times a day (TID) | ORAL | Status: DC | PRN

## 2020-05-02 MED ORDER — FERROUS GLUCONATE 324 (38 FE) MG PO TABS: 324.0000 mg | ORAL_TABLET | Freq: Every evening | ORAL | 3 refills | Status: AC

## 2020-05-02 MED ORDER — LEVOTHYROXINE SODIUM 50 MCG PO TABS: 50.0000 ug | ORAL_TABLET | Freq: Every day | ORAL | 1 refills | Status: AC

## 2020-05-02 NOTE — Plan of Care (Signed)

## 2020-05-02 NOTE — ED Notes (Signed)
Pt medicated per MAR, chux pad and brief dry at this time, purewick in place, functioning WNL, suction canister changed, 104mL urine output. Pt repositioned in bed. Denies further needs at this time.

## 2020-05-02 NOTE — ED Notes (Signed)
Pt resting. A&O x2 but easily reoriented. Pt denies any needs at this current time.

## 2020-05-02 NOTE — ED Notes (Signed)
Pt requesting cough medicine, none PRN on MAR. Dr. Sheran Luz aware, orders to be placed.

## 2020-05-02 NOTE — Discharge Summary (Addendum)
Discharge Summary  Sally Hardy HYQ:[id number] DOB: December 01, 1945  PCP: [facility] date: 05/01/2020 Discharge date: 05/02/2020  Time spent: 25 minutes  Recommendations for Outpatient Follow-up:  1. New medication: Patient is supposed to be on iron supplement although patient's daughter does not recall this medication.  I had given a new prescription for this. 2. New medication: Synthroid 50 mcg p.o. daily.  Patient is supposed to be on this medication at some point however the patient daughter cannot recall this medication and it was not listed in a previous medication profile, although a family member does recall this medicine.  Based off of her TSH, she should be on Synthroid medication.  Have given a prescription for this as well. 3. Patient will follow up with her PCP in the next 1 month. 4. Have advised the patient get a colonoscopy to follow-up on her iron deficiency anemia.  If she elects to do so, information has been given to her to follow-up with GI. 5. Patient is Covid positive.  Have recommended that she currently isolate for the next 10 days and minimize contact and wear a mask.  Addendum: CEA level came back elevated at 12.7. Contacted patient's PCP at [facility]. Patient has not been seen in the office since January 2020, but I informed him of the result message if they would reach out to the patient to set up an appointment. Also contacted the patient's daughter and explained the results and urged them that in a week or so after patient is no longer contagious from [place], she needs to be seen in the office and have a colonoscopy done. Elevated CEA level at 12.7 is suspicious for adenocarcinoma although not markedly elevated indicating widespread adenocarcinoma of the colon.  Discharge Diagnoses:  Active Hospital Problems   Diagnosis Date Noted   Iron deficiency anemia    Hypothyroid 05/02/2020   Coronary artery disease    Senile  dementia uncomp, without behavioral disturbance (HCC)    Diabetes mellitus without complication (HCC)    Hypertension    CKD (chronic kidney disease), stage IV (HCC)    Seizure disorder ([place])    Hypertensive urgency    COVID-19 virus infection     Resolved Hospital Problems  No resolved problems to display.    Discharge Condition: Improved, being discharged home  Diet recommendation: Renal diet  Vitals:   05/02/20 1005 05/02/20 1100  BP: (!) 186/84 (!) 174/88  Pulse: 64 68  Resp: 16   Temp: 98.8 F (37.1 C)   SpO2: 97%     History of present illness: Sally Hardy is a 75 y.o. female with medical history significant for mild dementia, seizure disorder, stage IV chronic kidney disease and hypertension who presented to the emergency room after a fall on 1/6.  Patient reports generalized weakness starting 2 days ago.  When she had a hard time getting up, EMS was called and patient was brought in.  She reports not being vaccinated.   ED Course: Patient tested positive for Covid in the emergency room although she is not hypoxic.  However, lab work noted BUN of 32/creatinine of 3.71 with a previous creatinine of 2.  9 6 a year ago.  Hemoglobin a year ago was 7.7 with MCV of 63 and today her hemoglobin is 5.6 with an MCV of 59.  Patient was Hemoccult positive but no signs of volume depletion.  Rather, she was noted to have hypertensive urgency with blood  pressure of 177/83 and heart rate of 64 on admission.  Case was discussed with gastroenterology.  Because she did not appear to be having a definitive active bleed and because of her positive Covid status, GI recommended outpatient work-up following stabilization.  2 units packed red blood cells were ordered for patient and hospitalist were called for further evaluation.   Hospital Course:  Principal Problem:   Iron deficiency anemia: No evidence of active bleeding.  Patient's blood pressure is actually elevated.  She was Hemoccult  positive, but based off of MCV, this looks much more consistent with an anemia from iron deficiency and/or her chronic kidney disease.  In review of her medications, she is supposed to be on iron supplement, but according to patient's daughter, she has not been on one for a while.  Have given a new prescription for this.  Also advised that there may be a chance that patient has an underlying colon cancer and a colonoscopy would benefit her.  She has previously never had a colonoscopy according the patient's daughter.  Have given information about follow-up with GI. Active Problems:   Coronary artery disease   Senile dementia uncomp, without behavioral disturbance ([place]): Stable, continue Aricept.    Diabetes mellitus without complication ([place]): X2J at 6.8.  Patient not on any medications at home.  Recommend she stay diet controlled.  Starting her medication may lead to episodes of hypoglycemia.    CKD (chronic kidney disease), stage IV ([place]): Looks to be at baseline.    Seizure disorder [facility]): No active seizures during hospitalization, continued on Keppra.  Primary hypertension with hypertensive urgency: Following blood transfusion and then diuresis plus restarting home meds, blood pressure stable.    COVID-19 virus infection: Incidentally found.  Patient is not vaccinated.  Discussed this with patient and her daughters.  Advised them that patient will need to isolate for the next 10 days.  She should wear a mask and not leave her house unless it is an emergency.  Others who, the contact with her she also wear masks.  Recommended that the patient's son and boyfriend also get vaccinated if they have not been.    Hypothyroid: There was a question as to whether or not patient was hypothyroid.  Synthroid was listed on one of her medicines as per a family member.  However patient's daughter cannot recall her being on Synthroid and it was not on any of her recent medications.  After checking a TSH, noted to  be elevated at 9.25.  Gave prescription for Synthroid 50 mcg as well as patient reportedly previously on.   Procedures:  Status post unit packed red blood cell transfusion 1/6  Consultations:  Case discussed with gastroenterology, Dr. Jenetta Downer  Discharge Exam: BP (!) 174/88    Pulse 68    Temp 98.8 F (37.1 C) (Oral)    Resp 16    Ht 5\' 7"  (1.702 m)    Wt 67.5 kg    SpO2 97%    BMI 23.31 kg/m   General: Alert and oriented x2, no acute distress Cardiovascular: Regular rate and rhythm, J9-E1, 2/6 systolic ejection murmur Respiratory: Clear to auscultation bilaterally  Discharge Instructions You were cared for by a hospitalist during your hospital stay. If you have any questions about your discharge medications or the care you received while you were in the hospital after you are discharged, you can call the unit and asked to speak with the hospitalist on call if the hospitalist that took care  of you is not available. Once you are discharged, your primary care physician will handle any further medical issues. Please note that NO REFILLS for any discharge medications will be authorized once you are discharged, as it is imperative that you return to your primary care physician (or establish a relationship with a primary care physician if you do not have one) for your aftercare needs so that they can reassess your need for medications and monitor your lab values.  Discharge Instructions    Diet - low sodium heart healthy   Complete by: As directed    Increase activity slowly   Complete by: As directed      Allergies as of 05/02/2020   No Known Allergies     Medication List    TAKE these medications   amLODipine 10 MG tablet Commonly known as: NORVASC Take 10 mg by mouth every evening.   aspirin EC 81 MG tablet Take 81 mg by mouth every morning.   atorvastatin 20 MG tablet Commonly known as: LIPITOR Take 20 mg by mouth every evening.   citalopram 20 MG tablet Commonly known  as: CELEXA Take 20 mg by mouth every morning.   cloNIDine 0.2 MG tablet Commonly known as: CATAPRES Take 0.2 mg by mouth 3 (three) times daily.   donepezil 10 MG tablet Commonly known as: ARICEPT Take 10 mg by mouth at bedtime.   famotidine 20 MG tablet Commonly known as: PEPCID Take 20 mg by mouth 2 (two) times daily.   ferrous gluconate 324 MG tablet Commonly known as: FERGON Take 1 tablet (324 mg total) by mouth every evening.   furosemide 20 MG tablet Commonly known as: LASIX Take 20 mg by mouth every other day.   hydrALAZINE 100 MG tablet Commonly known as: APRESOLINE Take 100 mg by mouth 2 (two) times a day.   levETIRAcetam 500 MG tablet Commonly known as: Keppra Take 1 tablet (500 mg total) by mouth 2 (two) times daily.   levothyroxine 50 MCG tablet Commonly known as: SYNTHROID Take 1 tablet (50 mcg total) by mouth daily before breakfast.   magnesium oxide 400 MG tablet Commonly known as: MAG-OX Take 400 mg by mouth 2 (two) times daily.   metoprolol tartrate 50 MG tablet Commonly known as: LOPRESSOR Take 50 mg by mouth 2 (two) times daily.   polyethylene glycol 17 g packet Commonly known as: MIRALAX / GLYCOLAX Take 17 g by mouth daily as needed for mild constipation.   potassium chloride SA 20 MEQ tablet Commonly known as: KLOR-CON Take 20 mEq by mouth every morning.   vitamin C 500 MG tablet Commonly known as: ASCORBIC ACID Take 500 mg by mouth every evening.      No Known Allergies    The results of significant diagnostics from this hospitalization (including imaging, microbiology, ancillary and laboratory) are listed below for reference.    Significant Diagnostic Studies: DG Chest Portable 1 View  Result Date: 04/30/2020 CLINICAL DATA:  Cough. EXAM: PORTABLE CHEST 1 VIEW COMPARISON:  01/29/2011. FINDINGS: Mediastinum hilar structures normal. Heart size stable. Low lung volumes with mild bibasilar subsegmental atelectasis. Mild left base  infiltrate cannot be excluded. No pleural effusion or pneumothorax. Degenerative changes scoliosis thoracic spine. IMPRESSION: Low lung volumes with mild bibasilar subsegmental atelectasis. Mild left base infiltrate cannot be excluded. Electronically Signed   By: Marcello Moores  Register   On: 04/30/2020 09:11    Microbiology: Recent Results (from the past 240 hour(s))  Resp Panel by RT-PCR (Flu A&B, Covid)  Nasopharyngeal Swab     Status: Abnormal   Collection Time: 04/30/20  9:11 AM   Specimen: Nasopharyngeal Swab; Nasopharyngeal(NP) swabs in vial transport medium  Result Value Ref Range Status   SARS Coronavirus 2 by RT PCR POSITIVE (A) NEGATIVE Final    Comment: RESULT CALLED TO, READ BACK BY AND VERIFIED WITH: SHANNON MCCARTNEY @1034  04/30/20 BY JONES,T (NOTE) SARS-CoV-2 target nucleic acids are DETECTED.  The SARS-CoV-2 RNA is generally detectable in upper respiratory specimens during the acute phase of infection. Positive results are indicative of the presence of the identified virus, but do not rule out bacterial infection or co-infection with other pathogens not detected by the test. Clinical correlation with patient history and other diagnostic information is necessary to determine patient infection status. The expected result is Negative.  Fact Sheet for Patients: EntrepreneurPulse.com.au  Fact Sheet for Healthcare Providers: IncredibleEmployment.be  This test is not yet approved or cleared by the [place] FDA and  has been authorized for detection and/or diagnosis of SARS-CoV-2 by FDA under an Emergency Use Authorization (EUA).  This EUA will remain in effect (meaning this tes t can be used) for the duration of  the COVID-19 declaration under Section 564(b)(1) of the Act, 21 U.S.C. section 360bbb-3(b)(1), unless the authorization is terminated or revoked sooner.     Influenza A by PCR NEGATIVE NEGATIVE Final   Influenza B by PCR NEGATIVE  NEGATIVE Final    Comment: (NOTE) The Xpert Xpress SARS-CoV-2/FLU/RSV plus assay is intended as an aid in the diagnosis of influenza from Nasopharyngeal swab specimens and should not be used as a sole basis for treatment. Nasal washings and aspirates are unacceptable for Xpert Xpress SARS-CoV-2/FLU/RSV testing.  Fact Sheet for Patients: EntrepreneurPulse.com.au  Fact Sheet for Healthcare Providers: IncredibleEmployment.be  This test is not yet approved or cleared by the [place] FDA and has been authorized for detection and/or diagnosis of SARS-CoV-2 by FDA under an Emergency Use Authorization (EUA). This EUA will remain in effect (meaning this test can be used) for the duration of the COVID-19 declaration under Section 564(b)(1) of the Act, 21 U.S.C. section 360bbb-3(b)(1), unless the authorization is terminated or revoked.  Performed at [facility], [address], [place], [place] [address]      Labs: Basic Metabolic Panel: Recent Labs  Lab 05/01/20 1144 05/02/20 0519  NA 139 139  K 4.8 4.6  CL 101 103  CO2 24 25  GLUCOSE 163* 124*  BUN 32* 31*  CREATININE 3.71* 3.39*  CALCIUM 8.8* 8.8*  MG 2.3  --    Liver Function Tests: Recent Labs  Lab 05/01/20 1144  AST 40  ALT 15  ALKPHOS 60  BILITOT 0.6  PROT 7.7  ALBUMIN 3.8   No results for input(s): LIPASE, AMYLASE in the last 168 hours. No results for input(s): AMMONIA in the last 168 hours. CBC: Recent Labs  Lab 05/01/20 1144 05/02/20 0519  WBC 8.4 6.9  NEUTROABS 6.8  --   HGB 5.6* 9.3*  HCT 23.1* 34.2*  MCV 58.9* 67.1*  PLT 228 207   Cardiac Enzymes: No results for input(s): CKTOTAL, CKMB, CKMBINDEX, TROPONINI in the last 168 hours. BNP: BNP (last 3 results) No results for input(s): BNP in the last 8760 hours.  ProBNP (last 3 results) No results for input(s): PROBNP in the last 8760 hours.  CBG: No results for input(s): GLUCAP in the last 168  hours.     Signed:  Annita Brod, MD [facility] 05/02/2020, 1:21  PM

## 2020-05-02 NOTE — Discharge Instructions (Signed)
10 Things You Can Do to Manage Your COVID-19 Symptoms at Home If you have possible or confirmed COVID-19: 1. Stay home from work and school. And stay away from other public places. If you must go out, avoid using any kind of public transportation, ridesharing, or taxis. 2. Monitor your symptoms carefully. If your symptoms get worse, call your healthcare provider immediately. 3. Get rest and stay hydrated. 4. If you have a medical appointment, call the healthcare provider ahead of time and tell them that you have or may have COVID-19. 5. For medical emergencies, call 911 and notify the dispatch personnel that you have or may have COVID-19. 6. Cover your cough and sneezes with a tissue or use the inside of your elbow. 7. Wash your hands often with soap and water for at least 20 seconds or clean your hands with an alcohol-based hand sanitizer that contains at least 60% alcohol. 8. As much as possible, stay in a specific room and away from other people in your home. Also, you should use a separate bathroom, if available. If you need to be around other people in or outside of the home, wear a mask. 9. Avoid sharing personal items with other people in your household, like dishes, towels, and bedding. 10. Clean all surfaces that are touched often, like counters, tabletops, and doorknobs. Use household cleaning sprays or wipes according to the label instructions. cdc.gov/coronavirus 10/25/2018 This information is not intended to replace advice given to you by your health care provider. Make sure you discuss any questions you have with your health care provider. Document Revised: 03/29/2019 Document Reviewed: 03/29/2019 Elsevier Patient Education  2020 [employer]   COVID-19 Frequently Asked Questions COVID-19 (coronavirus disease) is an infection that is caused by a large family of viruses. Some viruses cause illness in people and others cause illness in animals like camels, cats, and bats. In some  cases, the viruses that cause illness in animals can spread to humans. Where did the coronavirus come from? In December 2019, [place] told the World Health Organization (WHO) of several cases of lung disease (human respiratory illness). These cases were linked to an open seafood and livestock market in the city of [place]. The link to the seafood and livestock market suggests that the virus may have spread from animals to humans. However, since that first outbreak in December, the virus has also been shown to spread from person to person. What is the name of the disease and the virus? Disease name Early on, this disease was called novel coronavirus. This is because scientists determined that the disease was caused by a new (novel) respiratory virus. The World Health Organization (WHO) has now named the disease COVID-19, or coronavirus disease. Virus name The virus that causes the disease is called severe acute respiratory syndrome coronavirus 2 (SARS-CoV-2). More information on disease and virus naming World Health Organization (WHO): www.who.int/emergencies/diseases/novel-coronavirus-2019/technical-guidance/naming-the-coronavirus-disease-(covid-2019)-and-the-virus-that-causes-it Who is at risk for complications from coronavirus disease? Some people may be at higher risk for complications from coronavirus disease. This includes older adults and people who have chronic diseases, such as heart disease, diabetes, and lung disease. If you are at higher risk for complications, take these extra precautions:  Stay home as much as possible.  Avoid social gatherings and travel.  Avoid close contact with others. Stay at least 6 ft (2 m) away from others, if possible.  Wash your hands often with soap and water for at least 20 seconds.  Avoid touching your face, mouth, nose, or eyes.    Keep supplies on hand at home, such as food, medicine, and cleaning supplies.  If you must go out in public, wear a cloth  face covering or face mask. Make sure your mask covers your nose and mouth. How does coronavirus disease spread? The virus that causes coronavirus disease spreads easily from person to person (is contagious). You may catch the virus by:  Breathing in droplets from an infected person. Droplets can be spread by a person breathing, speaking, singing, coughing, or sneezing.  Touching something, like a table or a doorknob, that was exposed to the virus (contaminated) and then touching your mouth, nose, or eyes. Can I get the virus from touching surfaces or objects? There is still a lot that we do not know about the virus that causes coronavirus disease. Scientists are basing a lot of information on what they know about similar viruses, such as:  Viruses cannot generally survive on surfaces for long. They need a human body (host) to survive.  It is more likely that the virus is spread by close contact with people who are sick (direct contact), such as through: ? Shaking hands or hugging. ? Breathing in respiratory droplets that travel through the air. Droplets can be spread by a person breathing, speaking, singing, coughing, or sneezing.  It is less likely that the virus is spread when a person touches a surface or object that has the virus on it (indirect contact). The virus may be able to enter the body if the person touches a surface or object and then touches his or her face, eyes, nose, or mouth. Can a person spread the virus without having symptoms of the disease? It may be possible for the virus to spread before a person has symptoms of the disease, but this is most likely not the main way the virus is spreading. It is more likely for the virus to spread by being in close contact with people who are sick and breathing in the respiratory droplets spread by a person breathing, speaking, singing, coughing, or sneezing. What are the symptoms of coronavirus disease? Symptoms vary from person to  person and can range from mild to severe. Symptoms may include:  Fever or chills.  Cough.  Difficulty breathing or feeling short of breath.  Headaches, body aches, or muscle aches.  Runny or stuffy (congested) nose.  Sore throat.  New loss of taste or smell.  Nausea, vomiting, or diarrhea. These symptoms can appear anywhere from 2 to 14 days after you have been exposed to the virus. Some people may not have any symptoms. If you develop symptoms, call your health care provider. People with severe symptoms may need hospital care. Should I be tested for this virus? Your health care provider will decide whether to test you based on your symptoms, history of exposure, and your risk factors. How does a health care provider test for this virus? Health care providers will collect samples to send for testing. Samples may include:  Taking a swab of fluid from the back of your nose and throat, your nose, or your throat.  Taking fluid from the lungs by having you cough up mucus (sputum) into a sterile cup.  Taking a blood sample. Is there a treatment or vaccine for this virus? Currently, there is no vaccine to prevent coronavirus disease. Also, there are no medicines like antibiotics or antivirals to treat the virus. A person who becomes sick is given supportive care, which means rest and fluids. A person may also   relieve his or her symptoms by using over-the-counter medicines that treat sneezing, coughing, and runny nose. These are the same medicines that a person takes for the common cold. If you develop symptoms, call your health care provider. People with severe symptoms may need hospital care. What can I do to protect myself and my family from this virus?     You can protect yourself and your family by taking the same actions that you would take to prevent the spread of other viruses. Take the following actions:  Wash your hands often with soap and water for at least 20 seconds. If soap  and water are not available, use alcohol-based hand sanitizer.  Avoid touching your face, mouth, nose, or eyes.  Cough or sneeze into a tissue, sleeve, or elbow. Do not cough or sneeze into your hand or the air. ? If you cough or sneeze into a tissue, throw it away immediately and wash your hands.  Disinfect objects and surfaces that you frequently touch every day.  Stay away from people who are sick.  Avoid going out in public, follow guidance from your state and local health authorities.  Avoid crowded indoor spaces. Stay at least 6 ft (2 m) away from others.  If you must go out in public, wear a cloth face covering or face mask. Make sure your mask covers your nose and mouth.  Stay home if you are sick, except to get medical care. Call your health care provider before you get medical care. Your health care provider will tell you how long to stay home.  Make sure your vaccines are up to date. Ask your health care provider what vaccines you need. What should I do if I need to travel? Follow travel recommendations from your local health authority, the CDC, and WHO. Travel information and advice  Centers for Disease Control and Prevention (CDC): www.cdc.gov/coronavirus/2019-ncov/travelers/index.html  World Health Organization (WHO): www.who.int/emergencies/diseases/novel-coronavirus-2019/travel-advice Know the risks and take action to protect your health  You are at higher risk of getting coronavirus disease if you are traveling to areas with an outbreak or if you are exposed to travelers from areas with an outbreak.  Wash your hands often and practice good hygiene to lower the risk of catching or spreading the virus. What should I do if I am sick? General instructions to stop the spread of infection  Wash your hands often with soap and water for at least 20 seconds. If soap and water are not available, use alcohol-based hand sanitizer.  Cough or sneeze into a tissue, sleeve, or  elbow. Do not cough or sneeze into your hand or the air.  If you cough or sneeze into a tissue, throw it away immediately and wash your hands.  Stay home unless you must get medical care. Call your health care provider or local health authority before you get medical care.  Avoid public areas. Do not take public transportation, if possible.  If you can, wear a mask if you must go out of the house or if you are in close contact with someone who is not sick. Make sure your mask covers your nose and mouth. Keep your home clean  Disinfect objects and surfaces that are frequently touched every day. This may include: ? Counters and tables. ? Doorknobs and light switches. ? Sinks and faucets. ? Electronics such as phones, remote controls, keyboards, computers, and tablets.  Wash dishes in hot, soapy water or use a dishwasher. Air-dry your dishes.  Wash laundry in   hot water. Prevent infecting other household members  Let healthy household members care for children and pets, if possible. If you have to care for children or pets, wash your hands often and wear a mask.  Sleep in a different bedroom or bed, if possible.  Do not share personal items, such as razors, toothbrushes, deodorant, combs, brushes, towels, and washcloths. Where to find more information Centers for Disease Control and Prevention (CDC)  Information and news updates: www.cdc.gov/coronavirus/2019-ncov World Health Organization (WHO)  Information and news updates: www.who.int/emergencies/diseases/novel-coronavirus-2019  Coronavirus health topic: www.who.int/health-topics/coronavirus  Questions and answers on COVID-19: www.who.int/news-room/q-a-detail/q-a-coronaviruses  Global tracker: who.sprinklr.com American Academy of Pediatrics (AAP)  Information for families: www.healthychildren.org/English/health-issues/conditions/chest-lungs/Pages/2019-Novel-Coronavirus.aspx The coronavirus situation is changing rapidly. Check  your local health authority website or the CDC and WHO websites for updates and news. When should I contact a health care provider?  Contact your health care provider if you have symptoms of an infection, such as fever or cough, and you: ? Have been near anyone who is known to have coronavirus disease. ? Have come into contact with a person who is suspected to have coronavirus disease. ? Have traveled to an area where there is an outbreak of COVID-19. When should I get emergency medical care?  Get help right away by calling your local emergency services (911 in the [place] if you have: ? Trouble breathing. ? Pain or pressure in your chest. ? Confusion. ? Blue-tinged lips and fingernails. ? Difficulty waking from sleep. ? Symptoms that get worse. Let the emergency medical personnel know if you think you have coronavirus disease. Summary  A new respiratory virus is spreading from person to person and causing COVID-19 (coronavirus disease).  The virus that causes COVID-19 appears to spread easily. It spreads from one person to another through droplets from breathing, speaking, singing, coughing, or sneezing.  Older adults and those with chronic diseases are at higher risk of disease. If you are at higher risk for complications, take extra precautions.  There is currently no vaccine to prevent coronavirus disease. There are no medicines, such as antibiotics or antivirals, to treat the virus.  You can protect yourself and your family by washing your hands often, avoiding touching your face, and covering your coughs and sneezes. This information is not intended to replace advice given to you by your health care provider. Make sure you discuss any questions you have with your health care provider. Document Revised: 02/09/2019 Document Reviewed: 08/08/2018 Elsevier Patient Education  2020 [employer] Inc.  

## 2020-05-02 NOTE — ED Notes (Signed)
Pt has a donor tag, watch, and one hoop earring placed in pt's belongings bag and taken with her to the floor.

## 2020-05-02 NOTE — Plan of Care (Signed)
Problem: Education: Goal: Knowledge of General Education information will improve Description: Including pain rating scale, medication(s)/side effects and non-pharmacologic comfort measures 05/02/2020 1414 by Adam Phenix, RN Outcome: Adequate for Discharge 05/02/2020 1049 by Adam Phenix, RN Outcome: Progressing   Problem: Health Behavior/Discharge Planning: Goal: Ability to manage health-related needs will improve 05/02/2020 1414 by Adam Phenix, RN Outcome: Adequate for Discharge 05/02/2020 1049 by Adam Phenix, RN Outcome: Progressing   Problem: Clinical Measurements: Goal: Ability to maintain clinical measurements within normal limits will improve 05/02/2020 1414 by Adam Phenix, RN Outcome: Adequate for Discharge 05/02/2020 1049 by Adam Phenix, RN Outcome: Progressing Goal: Will remain free from infection 05/02/2020 1414 by Adam Phenix, RN Outcome: Adequate for Discharge 05/02/2020 1049 by Adam Phenix, RN Outcome: Progressing Goal: Diagnostic test results will improve 05/02/2020 1414 by Adam Phenix, RN Outcome: Adequate for Discharge 05/02/2020 1049 by Adam Phenix, RN Outcome: Progressing Goal: Respiratory complications will improve 05/02/2020 1414 by Adam Phenix, RN Outcome: Adequate for Discharge 05/02/2020 1049 by Adam Phenix, RN Outcome: Progressing Goal: Cardiovascular complication will be avoided 05/02/2020 1414 by Adam Phenix, RN Outcome: Adequate for Discharge 05/02/2020 1049 by Adam Phenix, RN Outcome: Progressing   Problem: Activity: Goal: Risk for activity intolerance will decrease 05/02/2020 1414 by Adam Phenix, RN Outcome: Adequate for Discharge 05/02/2020 1049 by Adam Phenix, RN Outcome: Progressing   Problem: Nutrition: Goal: Adequate nutrition will be maintained 05/02/2020 1414 by Adam Phenix, RN Outcome: Adequate for  Discharge 05/02/2020 1049 by Adam Phenix, RN Outcome: Progressing   Problem: Coping: Goal: Level of anxiety will decrease 05/02/2020 1414 by Adam Phenix, RN Outcome: Adequate for Discharge 05/02/2020 1049 by Adam Phenix, RN Outcome: Progressing   Problem: Elimination: Goal: Will not experience complications related to bowel motility 05/02/2020 1414 by Adam Phenix, RN Outcome: Adequate for Discharge 05/02/2020 1049 by Adam Phenix, RN Outcome: Progressing Goal: Will not experience complications related to urinary retention 05/02/2020 1414 by Adam Phenix, RN Outcome: Adequate for Discharge 05/02/2020 1049 by Adam Phenix, RN Outcome: Progressing   Problem: Pain Managment: Goal: General experience of comfort will improve 05/02/2020 1414 by Adam Phenix, RN Outcome: Adequate for Discharge 05/02/2020 1049 by Adam Phenix, RN Outcome: Progressing   Problem: Safety: Goal: Ability to remain free from injury will improve 05/02/2020 1414 by Adam Phenix, RN Outcome: Adequate for Discharge 05/02/2020 1049 by Adam Phenix, RN Outcome: Progressing   Problem: Skin Integrity: Goal: Risk for impaired skin integrity will decrease 05/02/2020 1414 by Adam Phenix, RN Outcome: Adequate for Discharge 05/02/2020 1049 by Adam Phenix, RN Outcome: Progressing   Problem: Education: Goal: Knowledge of risk factors and measures for prevention of condition will improve 05/02/2020 1414 by Adam Phenix, RN Outcome: Adequate for Discharge 05/02/2020 1049 by Adam Phenix, RN Outcome: Progressing   Problem: Coping: Goal: Psychosocial and spiritual needs will be supported 05/02/2020 1414 by Adam Phenix, RN Outcome: Adequate for Discharge 05/02/2020 1049 by Adam Phenix, RN Outcome: Progressing   Problem: Respiratory: Goal: Will maintain a patent airway 05/02/2020 1414 by Adam Phenix, RN Outcome: Adequate for Discharge 05/02/2020 1049 by Adam Phenix, RN Outcome: Progressing Goal: Complications related to the disease process, condition or treatment will be avoided or minimized 05/02/2020 1414 by Adam Phenix, RN Outcome: Adequate for Discharge 05/02/2020 1049 by Adam Phenix, RN Outcome:  Progressing

## 2020-05-03 LAB — CEA: CEA: 12.7 ng/mL — ABNORMAL HIGH (ref 0.0–4.7)

## 2020-05-07 ENCOUNTER — Telehealth: Payer: Self-pay

## 2020-05-07 NOTE — Telephone Encounter (Signed)
Volunteer called patient on behalf of Palliative Care and did not get a answer from patient/family. ° °

## 2020-05-08 ENCOUNTER — Emergency Department (HOSPITAL_COMMUNITY)
Admission: EM | Admit: 2020-05-08 | Discharge: 2020-05-08 | Disposition: A | Discharge status: Home / Self Care | Payer: Medicare Other | Attending: Emergency Medicine | Admitting: Emergency Medicine

## 2020-05-08 ENCOUNTER — Emergency Department (HOSPITAL_COMMUNITY): Payer: Medicare Other

## 2020-05-08 ENCOUNTER — Encounter (HOSPITAL_COMMUNITY): Payer: Self-pay

## 2020-05-08 DIAGNOSIS — Z79899 Other long term (current) drug therapy: Secondary | ICD-10-CM | POA: Insufficient documentation

## 2020-05-08 DIAGNOSIS — I2510 Atherosclerotic heart disease of native coronary artery without angina pectoris: Secondary | ICD-10-CM | POA: Diagnosis not present

## 2020-05-08 DIAGNOSIS — U071 COVID-19: Secondary | ICD-10-CM | POA: Insufficient documentation

## 2020-05-08 DIAGNOSIS — N184 Chronic kidney disease, stage 4 (severe): Secondary | ICD-10-CM | POA: Insufficient documentation

## 2020-05-08 DIAGNOSIS — I129 Hypertensive chronic kidney disease with stage 1 through stage 4 chronic kidney disease, or unspecified chronic kidney disease: Secondary | ICD-10-CM | POA: Diagnosis not present

## 2020-05-08 DIAGNOSIS — R531 Weakness: Secondary | ICD-10-CM | POA: Diagnosis present

## 2020-05-08 DIAGNOSIS — E039 Hypothyroidism, unspecified: Secondary | ICD-10-CM | POA: Diagnosis not present

## 2020-05-08 DIAGNOSIS — Z7982 Long term (current) use of aspirin: Secondary | ICD-10-CM | POA: Insufficient documentation

## 2020-05-08 DIAGNOSIS — E1122 Type 2 diabetes mellitus with diabetic chronic kidney disease: Secondary | ICD-10-CM | POA: Insufficient documentation

## 2020-05-08 DIAGNOSIS — Z8616 Personal history of COVID-19: Secondary | ICD-10-CM | POA: Insufficient documentation

## 2020-05-08 DIAGNOSIS — F0390 Unspecified dementia without behavioral disturbance: Secondary | ICD-10-CM | POA: Diagnosis not present

## 2020-05-08 LAB — CBC WITH DIFFERENTIAL/PLATELET
Abs Immature Granulocytes: 0.16 10*3/uL — ABNORMAL HIGH (ref 0.00–0.07)
Basophils Absolute: 0 10*3/uL (ref 0.0–0.1)
Basophils Relative: 1 %
Dimorphism: 6
Eosinophils Absolute: 0 10*3/uL (ref 0.0–0.5)
Eosinophils Relative: 0 %
HCT: 36.2 % (ref 36.0–46.0)
Hemoglobin: 9.7 g/dL — ABNORMAL LOW (ref 12.0–15.0)
Immature Granulocytes: 2 %
Lymphocytes Relative: 20 %
Lymphs Abs: 1.3 10*3/uL (ref 0.7–4.0)
MCH: 18.6 pg — ABNORMAL LOW (ref 26.0–34.0)
MCHC: 26.8 g/dL — ABNORMAL LOW (ref 30.0–36.0)
MCV: 69.3 fL — ABNORMAL LOW (ref 80.0–100.0)
Monocytes Absolute: 0.8 10*3/uL (ref 0.1–1.0)
Monocytes Relative: 13 %
Neutro Abs: 4.3 10*3/uL (ref 1.7–7.7)
Neutrophils Relative %: 64 %
Platelets: 562 10*3/uL — ABNORMAL HIGH (ref 150–400)
RBC: 5.22 MIL/uL — ABNORMAL HIGH (ref 3.87–5.11)
WBC: 6.7 10*3/uL (ref 4.0–10.5)
nRBC: 0.3 % — ABNORMAL HIGH (ref 0.0–0.2)

## 2020-05-08 LAB — URINALYSIS, ROUTINE W REFLEX MICROSCOPIC
Bilirubin Urine: NEGATIVE
Glucose, UA: NEGATIVE mg/dL
Ketones, ur: NEGATIVE mg/dL
Leukocytes,Ua: NEGATIVE
Nitrite: NEGATIVE
Protein, ur: >=300 mg/dL — AB
Specific Gravity, Urine: 1.012 (ref 1.005–1.030)
pH: 5 (ref 5.0–8.0)

## 2020-05-08 LAB — COMPREHENSIVE METABOLIC PANEL
ALT: 24 U/L (ref 0–44)
AST: 64 U/L — ABNORMAL HIGH (ref 15–41)
Albumin: 3.7 g/dL (ref 3.5–5.0)
Alkaline Phosphatase: 102 U/L (ref 38–126)
Anion gap: 12 (ref 5–15)
BUN: 29 mg/dL — ABNORMAL HIGH (ref 8–23)
CO2: 23 mmol/L (ref 22–32)
Calcium: 9 mg/dL (ref 8.9–10.3)
Chloride: 103 mmol/L (ref 98–111)
Creatinine, Ser: 3.93 mg/dL — ABNORMAL HIGH (ref 0.44–1.00)
GFR, Estimated: 11 mL/min — ABNORMAL LOW (ref >60)
Glucose, Bld: 162 mg/dL — ABNORMAL HIGH (ref 70–99)
Potassium: 4.5 mmol/L (ref 3.5–5.1)
Sodium: 138 mmol/L (ref 135–145)
Total Bilirubin: 0.5 mg/dL (ref 0.3–1.2)
Total Protein: 7.9 g/dL (ref 6.5–8.1)

## 2020-05-08 NOTE — ED Notes (Signed)
RN notified about vitals 

## 2020-05-08 NOTE — Discharge Instructions (Addendum)
Follow-up with your family doctor next week for recheck. 

## 2020-05-08 NOTE — ED Provider Notes (Signed)
[facility] Provider Note   CSN: [id number] Arrival date & time: 05/08/20  1007     History Chief Complaint  Patient presents with  . Weakness    Sally Hardy is a 75 y.o. female.  Patient recently was admitted to the hospital for anemia and COVID 19 along with hypertension poorly controlled.  Patient brought back today for weakness  The history is provided by the patient and a relative.  Weakness Severity:  Mild Onset quality:  Gradual Timing:  Constant Progression:  Waxing and waning Chronicity:  Recurrent Context: not alcohol use   Relieved by:  Nothing Worsened by:  Nothing Ineffective treatments:  None tried Associated symptoms: no abdominal pain, no chest pain, no cough, no diarrhea, no frequency, no headaches and no seizures        Past Medical History:  Diagnosis Date  . Coronary artery disease   . Dementia ([place])   . Diabetes mellitus without complication ([place])    pt denies, ems reports pt does have history of DM  . Hypertension   . Renal disorder   . Seizures ([place])   . Stroke [facility])     Patient Active Problem List   Diagnosis Date Noted  . Hypothyroid 05/02/2020  . Coronary artery disease   . Senile dementia uncomp, without behavioral disturbance ([place])   . Diabetes mellitus without complication ([place])   . Hypertension   . CKD (chronic kidney disease), stage IV ([place])   . Seizure disorder ([place])   . Iron deficiency anemia   . Hypertensive urgency   . COVID-19 virus infection     History reviewed. No pertinent surgical history.   OB History   No obstetric history on file.     History reviewed. No pertinent family history.  Social History   Tobacco Use  . Smoking status: Never Smoker  . Smokeless tobacco: Never Used  Substance Use Topics  . Alcohol use: No  . Drug use: No    Home Medications Prior to Admission medications   Medication Sig Start Date End Date Taking? Authorizing Provider  amLODipine (NORVASC) 10  MG tablet Take 10 mg by mouth every evening.     [provider]  aspirin EC 81 MG tablet Take 81 mg by mouth every morning.     [provider]  atorvastatin (LIPITOR) 20 MG tablet Take 20 mg by mouth every evening.     [provider]  citalopram (CELEXA) 20 MG tablet Take 20 mg by mouth every morning.     [provider]  cloNIDine (CATAPRES) 0.2 MG tablet Take 0.2 mg by mouth 3 (three) times daily.    [provider]  donepezil (ARICEPT) 10 MG tablet Take 10 mg by mouth at bedtime.    [provider]  famotidine (PEPCID) 20 MG tablet Take 20 mg by mouth 2 (two) times daily. 04/09/19   [provider]  ferrous gluconate (FERGON) 324 MG tablet Take 1 tablet (324 mg total) by mouth every evening. 05/02/20   Annita Brod, MD  furosemide (LASIX) 20 MG tablet Take 20 mg by mouth every other day.    [provider]  hydrALAZINE (APRESOLINE) 100 MG tablet Take 100 mg by mouth 2 (two) times a day.     [provider]  levETIRAcetam (KEPPRA) 500 MG tablet Take 1 tablet (500 mg total) by mouth 2 (two) times daily. 04/10/19   Milton Ferguson, MD  levothyroxine (SYNTHROID) 50 MCG tablet Take 1  tablet (50 mcg total) by mouth daily before breakfast. 05/02/20   Annita Brod, MD  magnesium oxide (MAG-OX) 400 MG tablet Take 400 mg by mouth 2 (two) times daily.     [provider]  metoprolol tartrate (LOPRESSOR) 50 MG tablet Take 50 mg by mouth 2 (two) times daily.    [provider]  polyethylene glycol (MIRALAX / GLYCOLAX) 17 g packet Take 17 g by mouth daily as needed for mild constipation.    [provider]  potassium chloride SA (K-DUR,KLOR-CON) 20 MEQ tablet Take 20 mEq by mouth every morning.     [provider]  vitamin C (ASCORBIC ACID) 500 MG tablet Take 500 mg by mouth every evening.    [provider]    Allergies    Patient has no known allergies.  Review of  Systems   Review of Systems  Constitutional: Negative for appetite change and fatigue.  HENT: Negative for congestion, ear discharge and sinus pressure.   Eyes: Negative for discharge.  Respiratory: Negative for cough.   Cardiovascular: Negative for chest pain.  Gastrointestinal: Negative for abdominal pain and diarrhea.  Genitourinary: Negative for frequency and hematuria.  Musculoskeletal: Negative for back pain.  Skin: Negative for rash.  Neurological: Positive for weakness. Negative for seizures and headaches.  Psychiatric/Behavioral: Negative for hallucinations.    Physical Exam Updated Vital Signs BP (!) 174/79   Pulse 82   Temp 98.4 F (36.9 C) (Oral)   Resp 16   Ht 5\' 7"  (1.702 m)   Wt 67.5 kg   SpO2 98%   BMI 23.31 kg/m   Physical Exam Vitals and nursing note reviewed.  Constitutional:      Appearance: She is well-developed.  HENT:     Head: Normocephalic.     Mouth/Throat:     Mouth: Mucous membranes are moist.  Eyes:     General: No scleral icterus.    Extraocular Movements: EOM normal.     Conjunctiva/sclera: Conjunctivae normal.  Neck:     Thyroid: No thyromegaly.  Cardiovascular:     Rate and Rhythm: Normal rate and regular rhythm.     Heart sounds: No murmur heard. No friction rub. No gallop.   Pulmonary:     Breath sounds: No stridor. No wheezing or rales.  Chest:     Chest wall: No tenderness.  Abdominal:     General: There is no distension.     Tenderness: There is no abdominal tenderness. There is no rebound.  Musculoskeletal:        General: No edema. Normal range of motion.     Cervical back: Neck supple.  Lymphadenopathy:     Cervical: No cervical adenopathy.  Skin:    Findings: No erythema or rash.  Neurological:     Mental Status: She is alert and oriented to person, place, and time.     Motor: No abnormal muscle tone.     Coordination: Coordination normal.  Psychiatric:        Mood and Affect: Mood and affect normal.         Behavior: Behavior normal.     ED Results / Procedures / Treatments   Labs (all labs ordered are listed, but only abnormal results are displayed) Labs Reviewed  CBC WITH DIFFERENTIAL/PLATELET - Abnormal; Notable for the following components:      Result Value   RBC 5.22 (*)    Hemoglobin 9.7 (*)    MCV 69.3 (*)    [facility]  18.6 (*)    MCHC 26.8 (*)    Platelets 562 (*)    nRBC 0.3 (*)    Abs Immature Granulocytes 0.16 (*)    All other components within normal limits  COMPREHENSIVE METABOLIC PANEL - Abnormal; Notable for the following components:   Glucose, Bld 162 (*)    BUN 29 (*)    Creatinine, Ser 3.93 (*)    AST 64 (*)    GFR, Estimated 11 (*)    All other components within normal limits  URINALYSIS, ROUTINE W REFLEX MICROSCOPIC - Abnormal; Notable for the following components:   APPearance CLOUDY (*)    Hgb urine dipstick SMALL (*)    Protein, ur >=300 (*)    Bacteria, UA RARE (*)    All other components within normal limits    EKG None  Radiology DG Chest Port 1 View  Result Date: 05/08/2020 CLINICAL DATA:  Shortness of breath, COVID-19 positive EXAM: PORTABLE CHEST 1 VIEW COMPARISON:  04/30/2020 FINDINGS: Cardiomediastinal contours are within normal limits. Linear scarring or atelectasis within the left lung base. Coarsened interstitial markings bilaterally, similar to prior. No new focal airspace consolidation. No pleural effusion or pneumothorax. IMPRESSION: Chronic lung changes without superimposed acute cardiopulmonary process. Linear scarring or atelectasis within the left lung base. Electronically Signed   By: Davina Poke D.O.   On: 05/08/2020 10:58    Procedures Procedures (including critical care time)  Medications Ordered in ED Medications - No data to display  ED Course  I have reviewed the triage vital signs and the nursing notes.  Pertinent labs & imaging results that were available during my care of the patient were reviewed by me and considered  in my medical decision making (see chart for details).    MDM Rules/Calculators/A&P                         Patient nontoxic with labs unchanged.  She will follow-up with her PCP Final Clinical Impression(s) / ED Diagnoses Final diagnoses:  [id number]    Rx / DC Orders ED Discharge Orders    None       Milton Ferguson, MD 05/10/20 1002

## 2020-05-08 NOTE — ED Triage Notes (Signed)
Pt diagnosed with COVID last Friday, pt today feeling weak and not feeling well.  Pt alert and oriented.

## 2020-05-13 ENCOUNTER — Other Ambulatory Visit: Payer: Self-pay

## 2020-05-13 ENCOUNTER — Emergency Department (HOSPITAL_COMMUNITY): Payer: Medicare Other

## 2020-05-13 ENCOUNTER — Encounter (HOSPITAL_COMMUNITY): Payer: Self-pay | Admitting: *Deleted

## 2020-05-13 ENCOUNTER — Emergency Department (HOSPITAL_COMMUNITY)
Admission: EM | Admit: 2020-05-13 | Discharge: 2020-05-14 | Disposition: A | Discharge status: Home / Self Care | Payer: Medicare Other | Attending: Emergency Medicine | Admitting: Emergency Medicine

## 2020-05-13 DIAGNOSIS — E1122 Type 2 diabetes mellitus with diabetic chronic kidney disease: Secondary | ICD-10-CM | POA: Diagnosis not present

## 2020-05-13 DIAGNOSIS — I2510 Atherosclerotic heart disease of native coronary artery without angina pectoris: Secondary | ICD-10-CM | POA: Diagnosis not present

## 2020-05-13 DIAGNOSIS — Z7982 Long term (current) use of aspirin: Secondary | ICD-10-CM | POA: Diagnosis not present

## 2020-05-13 DIAGNOSIS — E039 Hypothyroidism, unspecified: Secondary | ICD-10-CM | POA: Diagnosis not present

## 2020-05-13 DIAGNOSIS — N184 Chronic kidney disease, stage 4 (severe): Secondary | ICD-10-CM | POA: Insufficient documentation

## 2020-05-13 DIAGNOSIS — C787 Secondary malignant neoplasm of liver and intrahepatic bile duct: Secondary | ICD-10-CM | POA: Insufficient documentation

## 2020-05-13 DIAGNOSIS — R110 Nausea: Secondary | ICD-10-CM | POA: Diagnosis present

## 2020-05-13 DIAGNOSIS — I129 Hypertensive chronic kidney disease with stage 1 through stage 4 chronic kidney disease, or unspecified chronic kidney disease: Secondary | ICD-10-CM | POA: Diagnosis not present

## 2020-05-13 DIAGNOSIS — Z79899 Other long term (current) drug therapy: Secondary | ICD-10-CM | POA: Insufficient documentation

## 2020-05-13 LAB — COMPREHENSIVE METABOLIC PANEL
ALT: 58 U/L — ABNORMAL HIGH (ref 0–44)
AST: 78 U/L — ABNORMAL HIGH (ref 15–41)
Albumin: 3.6 g/dL (ref 3.5–5.0)
Alkaline Phosphatase: 124 U/L (ref 38–126)
Anion gap: 15 (ref 5–15)
BUN: 33 mg/dL — ABNORMAL HIGH (ref 8–23)
CO2: 20 mmol/L — ABNORMAL LOW (ref 22–32)
Calcium: 9.4 mg/dL (ref 8.9–10.3)
Chloride: 102 mmol/L (ref 98–111)
Creatinine, Ser: 3.18 mg/dL — ABNORMAL HIGH (ref 0.44–1.00)
GFR, Estimated: 15 mL/min — ABNORMAL LOW (ref >60)
Glucose, Bld: 162 mg/dL — ABNORMAL HIGH (ref 70–99)
Potassium: 4.5 mmol/L (ref 3.5–5.1)
Sodium: 137 mmol/L (ref 135–145)
Total Bilirubin: 1 mg/dL (ref 0.3–1.2)
Total Protein: 8.3 g/dL — ABNORMAL HIGH (ref 6.5–8.1)

## 2020-05-13 LAB — CBC
HCT: 31.7 % — ABNORMAL LOW (ref 36.0–46.0)
Hemoglobin: 10 g/dL — ABNORMAL LOW (ref 12.0–15.0)
MCH: 21.1 pg — ABNORMAL LOW (ref 26.0–34.0)
MCHC: 31.5 g/dL (ref 30.0–36.0)
MCV: 67 fL — ABNORMAL LOW (ref 80.0–100.0)
Platelets: 1062 10*3/uL (ref 150–400)
RBC: 4.73 MIL/uL (ref 3.87–5.11)
RDW: 34.8 % — ABNORMAL HIGH (ref 11.5–15.5)
WBC: 16.8 10*3/uL — ABNORMAL HIGH (ref 4.0–10.5)
nRBC: 0.3 % — ABNORMAL HIGH (ref 0.0–0.2)

## 2020-05-13 LAB — LACTIC ACID, PLASMA: Lactic Acid, Venous: 2.3 mmol/L (ref 0.5–1.9)

## 2020-05-13 LAB — MAGNESIUM: Magnesium: 2.3 mg/dL (ref 1.7–2.4)

## 2020-05-13 LAB — LIPASE, BLOOD: Lipase: 44 U/L (ref 11–51)

## 2020-05-13 LAB — PHOSPHORUS: Phosphorus: 2.5 mg/dL (ref 2.5–4.6)

## 2020-05-13 LAB — C-REACTIVE PROTEIN: CRP: 13.5 mg/dL — ABNORMAL HIGH (ref <1.0)

## 2020-05-13 MED ORDER — SODIUM CHLORIDE 0.9 % IV BOLUS
500.0000 mL | Freq: Once | INTRAVENOUS | Status: AC
  Administered 2020-05-13: 500 mL via INTRAVENOUS

## 2020-05-13 MED ORDER — PANTOPRAZOLE SODIUM 40 MG IV SOLR
40.0000 mg | Freq: Once | INTRAVENOUS | Status: AC
  Administered 2020-05-13: 40 mg via INTRAVENOUS
  Filled 2020-05-13: qty 40

## 2020-05-13 NOTE — ED Notes (Signed)
CRITICAL VALUE ALERT  Critical Value:  Lactic Acid 2.3 Date & Time Notied: 05/13/20 @ 2307 Provider Notified: Dr. Kathrynn Humble Orders Received/Actions taken: None yet

## 2020-05-13 NOTE — ED Triage Notes (Signed)
Pt with nausea since this morning per pt.  [facility] reports that with generalized weakness and has been to pt's house multiple times and that son insisted pt be seen.

## 2020-05-13 NOTE — ED Notes (Signed)
Date and time results received: 05/13/20 1500 (use smartphrase ".now" to insert current time)  Test: Platelets Critical Value: 1062  Name of Provider Notified: Alvino Chapel, MD  Orders Received? Or Actions Taken?:

## 2020-05-14 ENCOUNTER — Telehealth: Payer: Self-pay | Admitting: Pulmonary Disease

## 2020-05-14 DIAGNOSIS — C787 Secondary malignant neoplasm of liver and intrahepatic bile duct: Secondary | ICD-10-CM | POA: Diagnosis not present

## 2020-05-14 LAB — LACTIC ACID, PLASMA: Lactic Acid, Venous: 1.4 mmol/L (ref 0.5–1.9)

## 2020-05-14 MED ORDER — LOPERAMIDE HCL 2 MG PO CAPS: 2.0000 mg | ORAL_CAPSULE | Freq: Four times a day (QID) | ORAL | 0 refills | Status: AC | PRN

## 2020-05-14 MED ORDER — ONDANSETRON 8 MG PO TBDP: 8.0000 mg | ORAL_TABLET | Freq: Three times a day (TID) | ORAL | 0 refills | Status: AC | PRN

## 2020-05-14 MED ORDER — PROMETHAZINE HCL 25 MG RE SUPP: 25.0000 mg | Freq: Four times a day (QID) | RECTAL | 0 refills | Status: AC | PRN

## 2020-05-14 NOTE — ED Provider Notes (Signed)
[facility] Provider Note   CSN: [id number] Arrival date & time: 05/13/20  1225     History Chief Complaint  Patient presents with  . Nausea    Sally Hardy is a 75 y.o. female.  HPI     75 year old female with history of dementia, diabetes, CKD comes in w/ chief complaint of nausea and feeling unwell. Patient has a history of DM, CAD, seizure, stroke. She was diagnosed with COVID-19 around Christmas and discharged on 1/4. While in the hospital, she was noted to have severe anemia and was transfused 2 units of PRBC. Her work-up also revealed elevated CEA levels and she is slated to follow-up with GI on 2 /7.  Level 5 caveat for dementia.  Patient informs me that she is feeling better. She has nausea with vomiting but it is not new. She reports mild abdominal discomfort. She denies any new cough, fevers. Patient was not a very good historian, therefore I also called her daughter.  Ms. Sally Hardy informs me that patient has never felt well since she was diagnosed with COVID-19. She would normally never asked to be seen by a physician, but earlier today she told her that she felt sick and wanted to see a doctor. Patient has been having persistent nausea, some emesis, reduced p.o. intake and diarrhea when she does eat. Bowel movements are loose, nonbloody.    Past Medical History:  Diagnosis Date  . Coronary artery disease   . Dementia ([place])   . Diabetes mellitus without complication ([place])    pt denies, ems reports pt does have history of DM  . Hypertension   . Renal disorder   . Seizures ([place])   . Stroke [facility])     Patient Active Problem List   Diagnosis Date Noted  . Hypothyroid 05/02/2020  . Coronary artery disease   . Senile dementia uncomp, without behavioral disturbance ([place])   . Diabetes mellitus without complication ([place])   . Hypertension   . CKD (chronic kidney disease), stage IV ([place])   . Seizure disorder ([place])   . Iron deficiency anemia    . Hypertensive urgency   . COVID-19 virus infection     History reviewed. No pertinent surgical history.   OB History   No obstetric history on file.     History reviewed. No pertinent family history.  Social History   Tobacco Use  . Smoking status: Never Smoker  . Smokeless tobacco: Never Used  Substance Use Topics  . Alcohol use: No  . Drug use: No    Home Medications Prior to Admission medications   Medication Sig Start Date End Date Taking? Authorizing Provider  amLODipine (NORVASC) 10 MG tablet Take 10 mg by mouth every evening.     [provider]  aspirin EC 81 MG tablet Take 81 mg by mouth every morning.     [provider]  atorvastatin (LIPITOR) 20 MG tablet Take 20 mg by mouth every evening.     [provider]  citalopram (CELEXA) 20 MG tablet Take 20 mg by mouth every morning.     [provider]  cloNIDine (CATAPRES) 0.2 MG tablet Take 0.2 mg by mouth 3 (three) times daily.    [provider]  donepezil (ARICEPT) 10 MG tablet Take 10 mg by mouth at bedtime.    [provider]  famotidine (PEPCID) 20 MG tablet Take 20 mg by mouth 2 (two) times daily. 04/09/19   [provider]  ferrous  gluconate (FERGON) 324 MG tablet Take 1 tablet (324 mg total) by mouth every evening. 05/02/20   Sally Brod, MD  furosemide (LASIX) 20 MG tablet Take 20 mg by mouth every other day.    [provider]  hydrALAZINE (APRESOLINE) 100 MG tablet Take 100 mg by mouth 2 (two) times a day.     [provider]  levETIRAcetam (KEPPRA) 500 MG tablet Take 1 tablet (500 mg total) by mouth 2 (two) times daily. 04/10/19   Sally Ferguson, MD  levothyroxine (SYNTHROID) 50 MCG tablet Take 1 tablet (50 mcg total) by mouth daily before breakfast. 05/02/20   Sally Brod, MD  magnesium oxide (MAG-OX) 400 MG tablet Take 400 mg by mouth 2 (two) times daily.     [provider]  metoprolol tartrate  (LOPRESSOR) 50 MG tablet Take 50 mg by mouth 2 (two) times daily.    [provider]  polyethylene glycol (MIRALAX / GLYCOLAX) 17 g packet Take 17 g by mouth daily as needed for mild constipation.    [provider]  potassium chloride SA (K-DUR,KLOR-CON) 20 MEQ tablet Take 20 mEq by mouth every morning.     [provider]  vitamin C (ASCORBIC ACID) 500 MG tablet Take 500 mg by mouth every evening.    [provider]    Allergies    Patient has no known allergies.  Review of Systems   Review of Systems  Constitutional: Positive for activity change.  Respiratory: Positive for cough.   Cardiovascular: Negative for chest pain.  Gastrointestinal: Positive for abdominal pain, diarrhea, nausea and vomiting.  Genitourinary: Negative for dysuria.  Allergic/Immunologic: Negative for immunocompromised state.  Hematological: Does not bruise/bleed easily.  All other systems reviewed and are negative.   Physical Exam Updated Vital Signs BP (!) 194/102   Pulse 91   Temp 98.3 F (36.8 C) (Oral)   Resp 20   SpO2 96%   Physical Exam Vitals and nursing note reviewed.  Constitutional:      Appearance: She is well-developed.  HENT:     Head: Normocephalic and atraumatic.  Eyes:     Extraocular Movements: EOM normal.  Cardiovascular:     Rate and Rhythm: Normal rate.  Pulmonary:     Effort: Pulmonary effort is normal.  Abdominal:     General: Bowel sounds are normal. There is no distension.     Palpations: There is no mass.     Tenderness: There is abdominal tenderness. There is no guarding or rebound.  Musculoskeletal:     Cervical back: Normal range of motion and neck supple.  Skin:    General: Skin is warm and dry.  Neurological:     Mental Status: She is alert and oriented to person, place, and time.     ED Results / Procedures / Treatments   Labs (all labs ordered are listed, but only abnormal results are displayed) Labs Reviewed   COMPREHENSIVE METABOLIC PANEL - Abnormal; Notable for the following components:      Result Value   CO2 20 (*)    Glucose, Bld 162 (*)    BUN 33 (*)    Creatinine, Ser 3.18 (*)    Total Protein 8.3 (*)    AST 78 (*)    ALT 58 (*)    GFR, Estimated 15 (*)    All other components within normal limits  CBC - Abnormal; Notable for the following components:   WBC 16.8 (*)    Hemoglobin  10.0 (*)    HCT 31.7 (*)    MCV 67.0 (*)    MCH 21.1 (*)    RDW 34.8 (*)    Platelets 1,062 (*)    nRBC 0.3 (*)    All other components within normal limits  LACTIC ACID, PLASMA - Abnormal; Notable for the following components:   Lactic Acid, Venous 2.3 (*)    All other components within normal limits  C-REACTIVE PROTEIN - Abnormal; Notable for the following components:   CRP 13.5 (*)    All other components within normal limits  CULTURE, BLOOD (ROUTINE X 2)  CULTURE, BLOOD (ROUTINE X 2)  URINE CULTURE  LIPASE, BLOOD  MAGNESIUM  PHOSPHORUS  URINALYSIS, ROUTINE W REFLEX MICROSCOPIC  LACTIC ACID, PLASMA    EKG None  Radiology CT ABDOMEN PELVIS WO CONTRAST  Result Date: 05/13/2020 CLINICAL DATA:  Nausea.  Generalized weakness. EXAM: CT ABDOMEN AND PELVIS WITHOUT CONTRAST TECHNIQUE: Multidetector CT imaging of the abdomen and pelvis was performed following the standard protocol without IV contrast. COMPARISON:  None. FINDINGS: Lower chest: There are coarse airspace opacities at the lung bases.There is a questionable mass in the right lower lung zone (axial series 2, image 1). This measures approximately 1.8 cm but is not well evaluated on this study. Hepatobiliary: There is a mass at the dome of the liver measuring approximately 3.6 cm (axial series 2, image 9). There is a dominant mass in the left hepatic lobe measuring approximately 6.3 cm (axial series 2, image 18). Additional smaller hepatic lesions are suspected but suboptimally evaluated in the absence of IV contrast. The gallbladder is  unremarkable. There is no definite biliary ductal dilatation.There is no biliary ductal dilation. Pancreas: The pancreas is suboptimally evaluated in the absence of IV contrast and motion artifact. Spleen: Unremarkable. Adrenals/Urinary Tract: --Adrenal glands: Unremarkable. --Right kidney/ureter: The right kidney is atrophic. --Left kidney/ureter: The left kidney is atrophic. --Urinary bladder: Unremarkable. Stomach/Bowel: --Stomach/Duodenum: No hiatal hernia or other gastric abnormality. Normal duodenal course and caliber. --Small bowel: Unremarkable. --Colon: There are scattered colonic diverticula without CT evidence for diverticulitis. There is masslike thickening of the sigmoid colon (axial series 2, image 65). --Appendix: Not visualized. No right lower quadrant inflammation or free fluid. Vascular/Lymphatic: Atherosclerotic calcification is present within the non-aneurysmal abdominal aorta, without hemodynamically significant stenosis. --No retroperitoneal lymphadenopathy. --No mesenteric lymphadenopathy. --No pelvic or inguinal lymphadenopathy. Reproductive: Status post hysterectomy. No adnexal mass. Other: No ascites or free air. The abdominal wall is normal. Musculoskeletal. Multilevel degenerative changes are noted throughout the patient's visualized thoracolumbar spine. There is no acute compression fracture. There are degenerative changes of both hips. There is nonspecific thickening of the left external oblique muscle (axial series 2, image 48). IMPRESSION: 1. Exam limited by motion artifact and lack of IV contrast. 2. There are multiple masses in the patient's liver concerning for metastatic disease. There is a likely mass at the sigmoid colon. Findings are suspicious for metastatic colorectal carcinoma. 3. Questionable 1.8 cm mass in the right lower lung zone. This may represent a confluence of normal structures versus a true pulmonary mass. Follow-up with CT is recommended. 4. Thickening of the left  external oblique muscle. This is of unknown clinical significance. This may be secondary to a traumatic injury or may represent an underlying metastatic lesion. 5. Atrophy of both kidneys. 6. Diverticulosis without CT evidence for diverticulitis. 7.  Aortic Atherosclerosis (ICD10-I70.0). Electronically Signed   By: Constance Holster M.D.   On: 05/13/2020 23:43   DG  Chest Port 1 View  Result Date: 05/13/2020 CLINICAL DATA:  Cough EXAM: PORTABLE CHEST 1 VIEW COMPARISON:  May 08, 2020 FINDINGS: The heart size and mediastinal contours are within normal limits. Again noted are mildly increased interstitial markings seen at both lung bases. No large airspace consolidation or pleural effusion. The visualized skeletal structures are unremarkable. IMPRESSION: No active disease. Electronically Signed   By: Prudencio Pair M.D.   On: 05/13/2020 21:33    Procedures Procedures (including critical care time)  Medications Ordered in ED Medications  pantoprazole (PROTONIX) injection 40 mg (40 mg Intravenous Given 05/13/20 2142)  sodium chloride 0.9 % bolus 500 mL (0 mLs Intravenous Stopped 05/13/20 2308)    ED Course  I have reviewed the triage vital signs and the nursing notes.  Pertinent labs & imaging results that were available during my care of the patient were reviewed by me and considered in my medical decision making (see chart for details).    MDM Rules/Calculators/A&P                          75 year old female comes in with chief complaint of nausea, feeling sick and having diarrhea. She is not a good historian, therefore daughter was contacted for further history. Records indicate that she was recently diagnosed with COVID-19 and also had elevated CEA levels in the setting of acute anemia that required transfusion.  Work-up included CBC, metabolic profile to ensure there is no severe anemia or electrolyte disturbance. Her CKD is at baseline per our labs.  She was having abdominal discomfort.  CT scan was ordered to rule out diarrhea secondary to obstruction and also to evaluate any chance of infection/abscess/colitis -CT revealed metastatic liver cancer findings.  It appears that patient already has been given follow-up with GI, but the appointment is not until 2-7.  I contacted both oncology service and GI service. Both services will be expediting the follow-up and patient will be seen late this month or first week of February.  I do not think inpatient care is indicated or needed at this time. She is hemodynamically stable, nontoxic-appearing. She does not have any medications for symptom control, therefore we will discharge her with loperamide and Zofran.  Strict ER return precautions have been discussed, and patient is agreeing with the plan and is comfortable with the workup done and the recommendations from the ER.   Final Clinical Impression(s) / ED Diagnoses Final diagnoses:  Malignant neoplasm metastatic to liver [facility])    Rx / DC Orders ED Discharge Orders    None       Varney Biles, MD 05/14/20 1610

## 2020-05-14 NOTE — Telephone Encounter (Signed)
-----   Message from Annie Paras sent at 05/14/2020  1:57 PM EST ----- Regarding: Incidental Findings Questionable 1.8 cm mass in the right lower lung zone. This may represent a confluence of normal structures versus a true pulmonary mass. Follow-up with CT is recommended.

## 2020-05-14 NOTE — Telephone Encounter (Signed)
[facility]:  We were contacted by the [place] Incidental Findings Program.  Recommendations: 1.8 cm lesion on CT abd pelvis recommending follow up. However, also has lesions in liver and colon were identified.   Appointment requested: None, appt has been made with Dr. Delton Coombes from oncology already on 05/27/2020.  Time frame: N/a   CC: Dr. Marletta Lor Pulmonary Critical Care 05/14/2020 2:02 PM

## 2020-05-14 NOTE — ED Notes (Signed)
0300: Checked with greeter stationed upfront in ED @ 3am and was told that pt was still waiting for ride in ED. Called pt's daughter again (was first notified of need to pick up pt by Dr Kathrynn Humble) who stated that she was unable to pick pt up until "am". I explained that her mother had been waiting since 1am in our lobby. Daughter  Banker) stated that she would try and find someone who could get her mother.

## 2020-05-14 NOTE — Discharge Instructions (Addendum)
The workup in the Er is concerning for cancer in the abdomen. We have contacted the GI doctors and Cancer doctors to get in touch with you for close follow up. They should call you back this week - if they dont do so by Friday please call the number provided.

## 2020-05-18 LAB — CULTURE, BLOOD (ROUTINE X 2)
Culture: NO GROWTH
Culture: NO GROWTH
Special Requests: ADEQUATE
Special Requests: ADEQUATE

## 2020-05-21 ENCOUNTER — Ambulatory Visit: Payer: Medicare Other | Admitting: Gastroenterology

## 2020-05-27 ENCOUNTER — Ambulatory Visit (HOSPITAL_COMMUNITY): Payer: Medicare Other | Admitting: Hematology

## 2020-07-02 ENCOUNTER — Ambulatory Visit: Payer: Medicare Other | Admitting: Gastroenterology

## 2020-07-25 DEATH — deceased

## 2022-01-17 IMAGING — DX DG CHEST 1V PORT
1 series · 1 of 1 positions shown · non-contrast
Comparison: 01/29/2011.

CLINICAL DATA: Cough.

EXAM:
PORTABLE CHEST 1 VIEW

[chest ap]
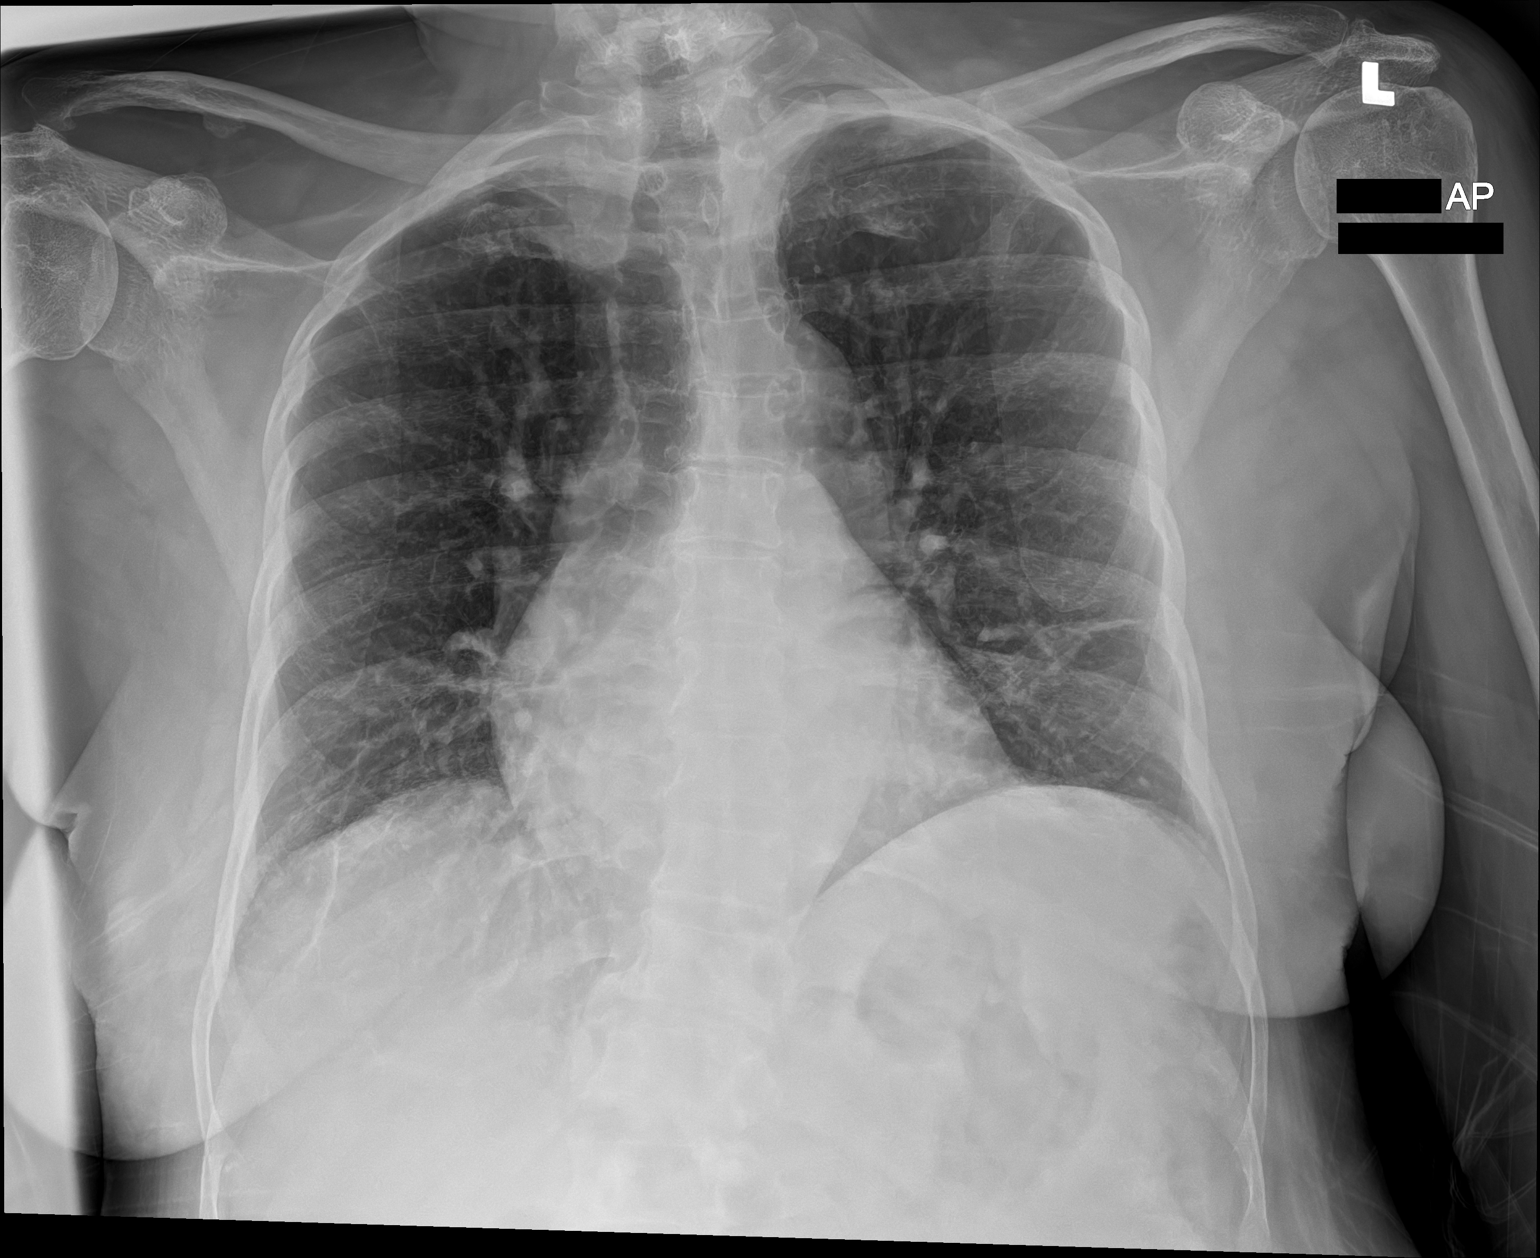

[1 of 1 positions shown; findings below may reference images not displayed]

FINDINGS: Mediastinum hilar structures normal. Heart size stable. Low lung
volumes with mild bibasilar subsegmental atelectasis. Mild left base
infiltrate cannot be excluded. No pleural effusion or pneumothorax.
Degenerative changes scoliosis thoracic spine.
IMPRESSION: Low lung volumes with mild bibasilar subsegmental atelectasis. Mild
left base infiltrate cannot be excluded.

## 2022-01-25 IMAGING — DX DG CHEST 1V PORT
1 series · 1 of 1 positions shown · non-contrast
Comparison: 04/30/2020

CLINICAL DATA: Shortness of breath, [id number] positive

EXAM:
PORTABLE CHEST 1 VIEW

[chest ap]
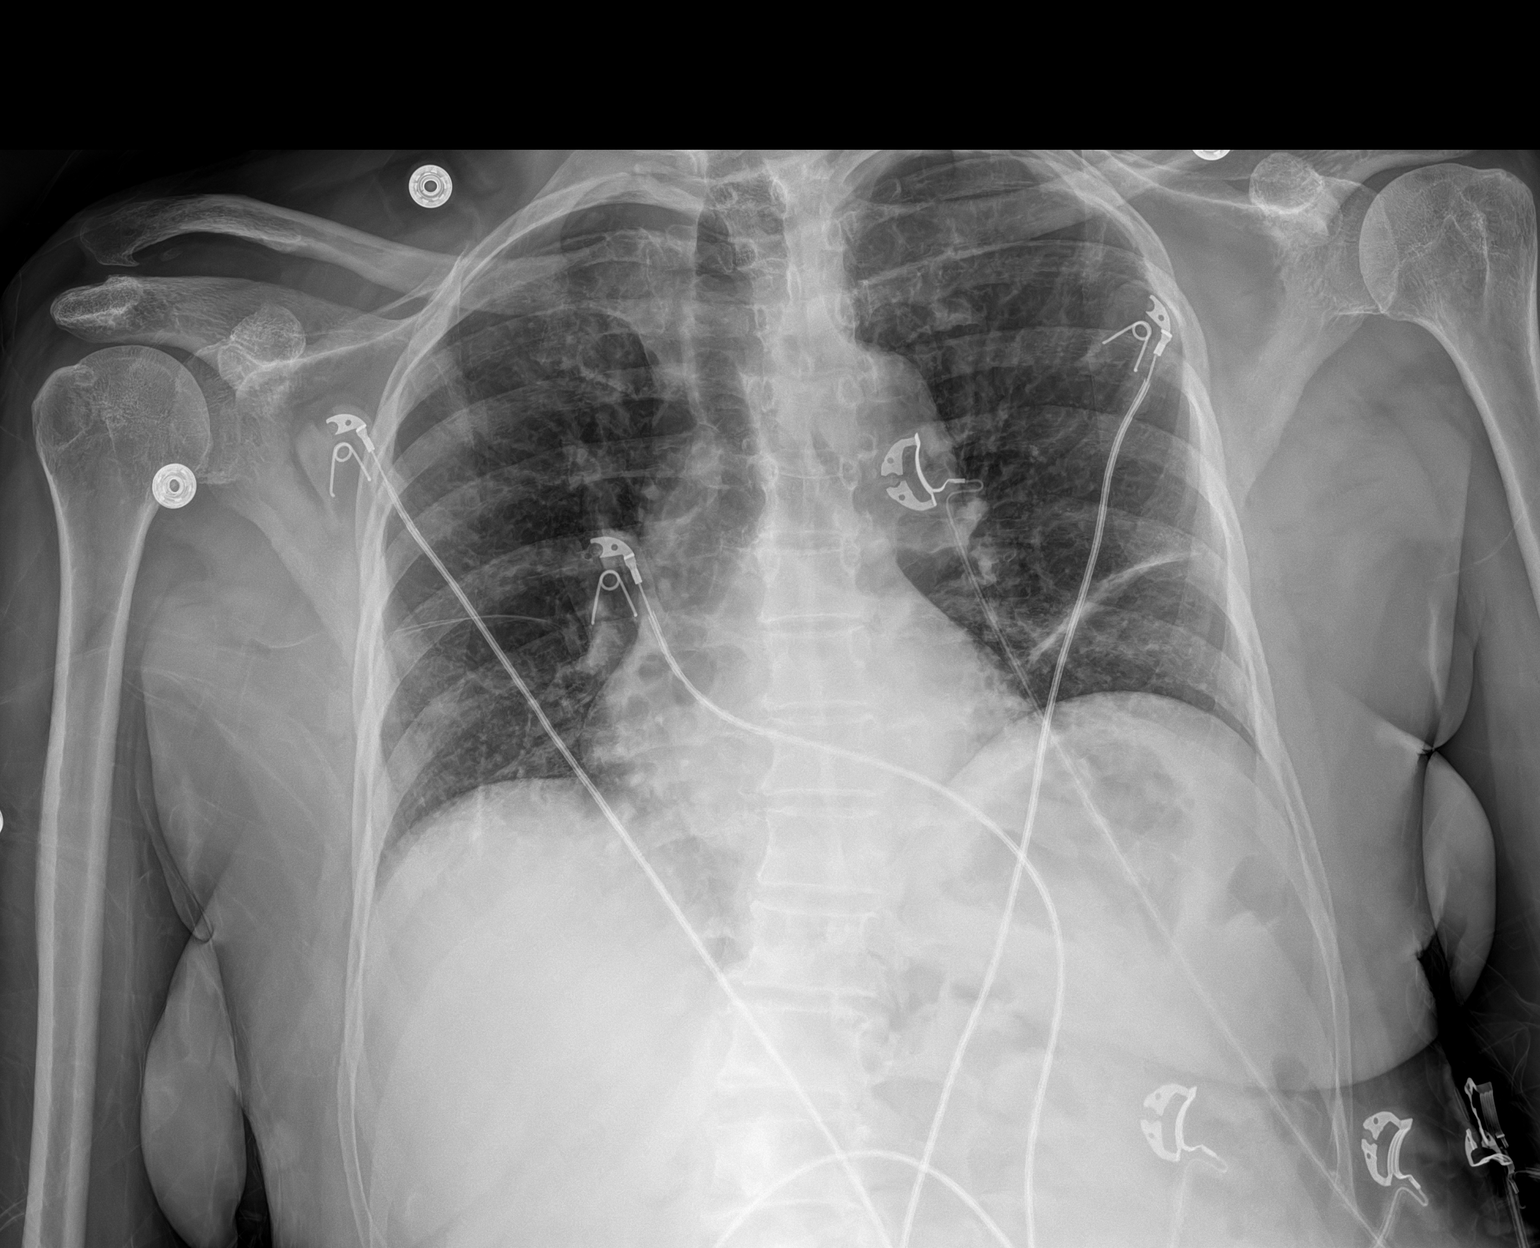

[1 of 1 positions shown; findings below may reference images not displayed]

FINDINGS: Cardiomediastinal contours are within normal limits. Linear scarring
or atelectasis within the left lung base. Coarsened interstitial
markings bilaterally, similar to prior. No new focal airspace
consolidation. No pleural effusion or pneumothorax.
IMPRESSION: Chronic lung changes without superimposed acute cardiopulmonary
process. Linear scarring or atelectasis within the left lung base.

## 2022-01-30 IMAGING — DX DG CHEST 1V PORT
1 series · 1 of 1 positions shown · non-contrast
Comparison: May 08, 2020

CLINICAL DATA: Cough

EXAM:
PORTABLE CHEST 1 VIEW

[chest ap]
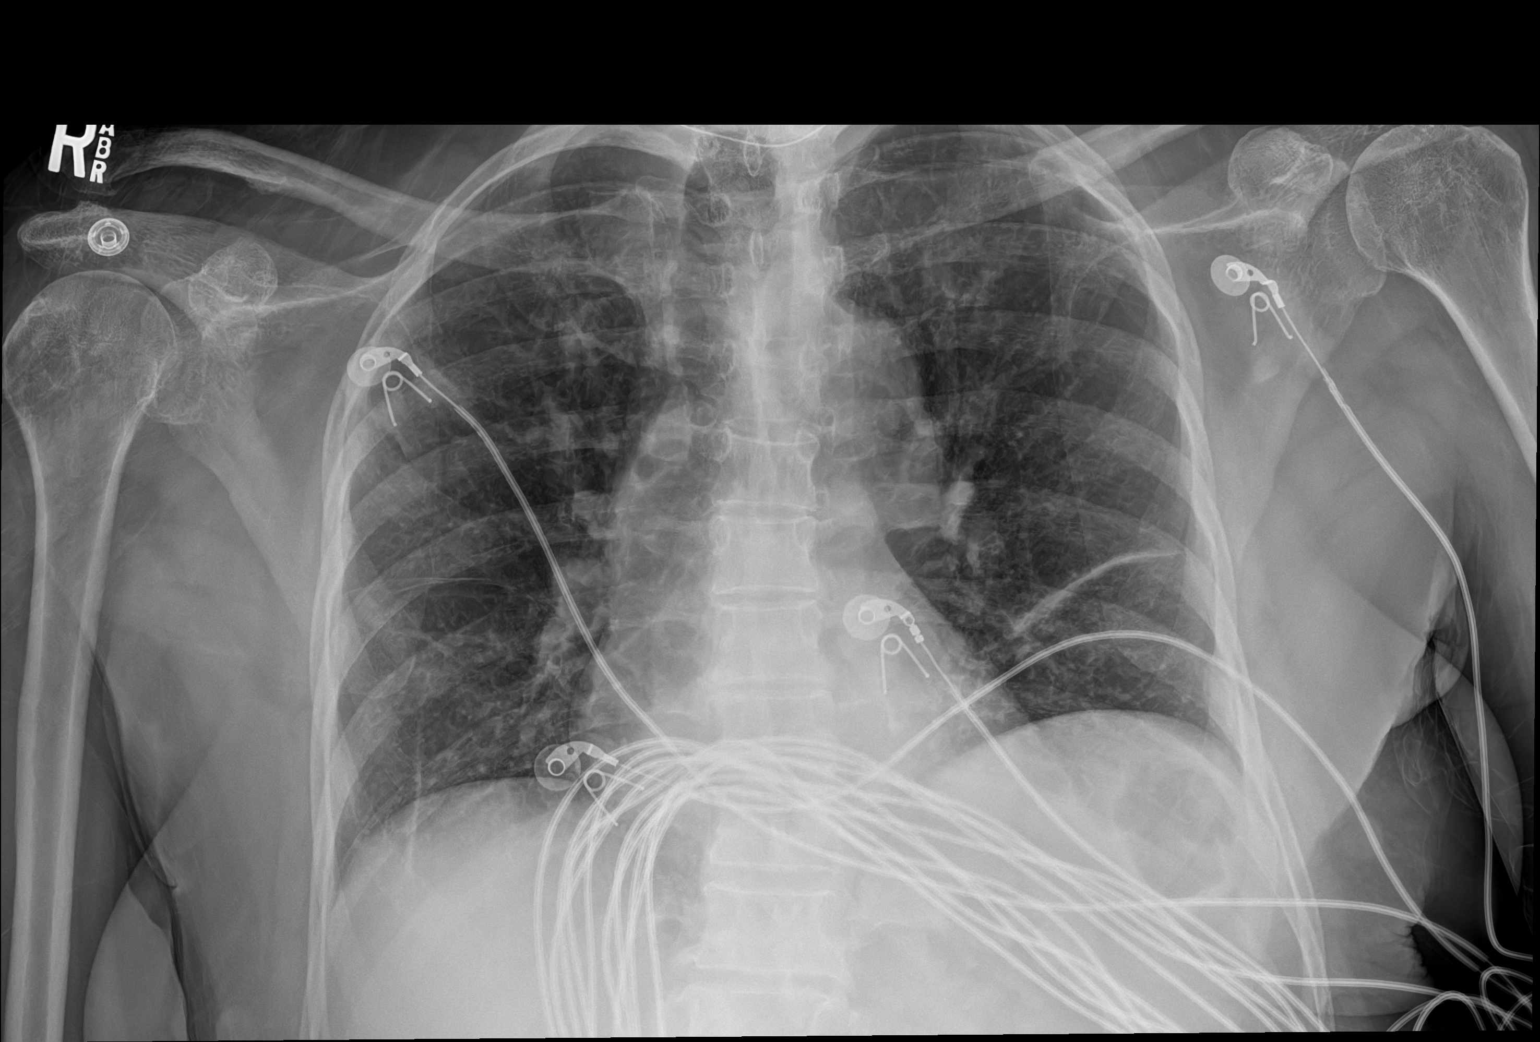

[1 of 1 positions shown; findings below may reference images not displayed]

FINDINGS: The heart size and mediastinal contours are within normal limits.
Again noted are mildly increased interstitial markings seen at both
lung bases. No large airspace consolidation or pleural effusion. The
visualized skeletal structures are unremarkable.
IMPRESSION: No active disease.
